# Patient Record
Sex: Female | Born: 1965 | Race: White | Hispanic: No | Marital: Single | Smoking: Never smoker
Health system: Southern US, Community
[De-identification: ages and names within clinical notes are randomized; demographics above are authoritative.]

## PROBLEM LIST (undated history)

## (undated) DIAGNOSIS — D649 Anemia, unspecified: Secondary | ICD-10-CM

## (undated) DIAGNOSIS — C50919 Malignant neoplasm of unspecified site of unspecified female breast: Secondary | ICD-10-CM

## (undated) DIAGNOSIS — G039 Meningitis, unspecified: Secondary | ICD-10-CM

## (undated) DIAGNOSIS — A770 Spotted fever due to Rickettsia rickettsii: Secondary | ICD-10-CM

## (undated) DIAGNOSIS — C539 Malignant neoplasm of cervix uteri, unspecified: Secondary | ICD-10-CM

## (undated) HISTORY — PX: BREAST SURGERY: SHX581

## (undated) HISTORY — PX: SPLENECTOMY: SUR1306

## (undated) HISTORY — PX: OTHER SURGICAL HISTORY: SHX169

---

## 2022-01-28 ENCOUNTER — Encounter (HOSPITAL_COMMUNITY): Payer: Self-pay

## 2022-01-28 ENCOUNTER — Emergency Department (HOSPITAL_COMMUNITY): Payer: Self-pay

## 2022-01-28 ENCOUNTER — Inpatient Hospital Stay (HOSPITAL_COMMUNITY)
Admission: EM | Admit: 2022-01-28 | Discharge: 2022-02-01 | DRG: 812 | Disposition: A | Discharge status: Home / Self Care | Payer: Self-pay | Attending: Internal Medicine | Admitting: Internal Medicine

## 2022-01-28 ENCOUNTER — Other Ambulatory Visit: Payer: Self-pay

## 2022-01-28 DIAGNOSIS — R1310 Dysphagia, unspecified: Secondary | ICD-10-CM

## 2022-01-28 DIAGNOSIS — R52 Pain, unspecified: Secondary | ICD-10-CM | POA: Diagnosis present

## 2022-01-28 DIAGNOSIS — T454X5A Adverse effect of iron and its compounds, initial encounter: Secondary | ICD-10-CM | POA: Diagnosis present

## 2022-01-28 DIAGNOSIS — D72829 Elevated white blood cell count, unspecified: Secondary | ICD-10-CM | POA: Diagnosis present

## 2022-01-28 DIAGNOSIS — Q438 Other specified congenital malformations of intestine: Secondary | ICD-10-CM

## 2022-01-28 DIAGNOSIS — D501 Sideropenic dysphagia: Secondary | ICD-10-CM | POA: Diagnosis present

## 2022-01-28 DIAGNOSIS — Z23 Encounter for immunization: Secondary | ICD-10-CM

## 2022-01-28 DIAGNOSIS — Z885 Allergy status to narcotic agent status: Secondary | ICD-10-CM

## 2022-01-28 DIAGNOSIS — G43909 Migraine, unspecified, not intractable, without status migrainosus: Secondary | ICD-10-CM | POA: Diagnosis present

## 2022-01-28 DIAGNOSIS — Z79899 Other long term (current) drug therapy: Secondary | ICD-10-CM

## 2022-01-28 DIAGNOSIS — K5909 Other constipation: Secondary | ICD-10-CM | POA: Diagnosis present

## 2022-01-28 DIAGNOSIS — Z20822 Contact with and (suspected) exposure to covid-19: Secondary | ICD-10-CM | POA: Diagnosis present

## 2022-01-28 DIAGNOSIS — Z853 Personal history of malignant neoplasm of breast: Secondary | ICD-10-CM

## 2022-01-28 DIAGNOSIS — F32A Depression, unspecified: Secondary | ICD-10-CM

## 2022-01-28 DIAGNOSIS — F4320 Adjustment disorder, unspecified: Secondary | ICD-10-CM | POA: Diagnosis present

## 2022-01-28 DIAGNOSIS — N3000 Acute cystitis without hematuria: Secondary | ICD-10-CM

## 2022-01-28 DIAGNOSIS — Z634 Disappearance and death of family member: Secondary | ICD-10-CM

## 2022-01-28 DIAGNOSIS — Z539 Procedure and treatment not carried out, unspecified reason: Secondary | ICD-10-CM | POA: Diagnosis present

## 2022-01-28 DIAGNOSIS — G8929 Other chronic pain: Secondary | ICD-10-CM | POA: Diagnosis present

## 2022-01-28 DIAGNOSIS — Z888 Allergy status to other drugs, medicaments and biological substances status: Secondary | ICD-10-CM

## 2022-01-28 DIAGNOSIS — Y92239 Unspecified place in hospital as the place of occurrence of the external cause: Secondary | ICD-10-CM | POA: Diagnosis present

## 2022-01-28 DIAGNOSIS — Z800 Family history of malignant neoplasm of digestive organs: Secondary | ICD-10-CM

## 2022-01-28 DIAGNOSIS — Z8541 Personal history of malignant neoplasm of cervix uteri: Secondary | ICD-10-CM

## 2022-01-28 DIAGNOSIS — F410 Panic disorder [episodic paroxysmal anxiety] without agoraphobia: Secondary | ICD-10-CM | POA: Diagnosis present

## 2022-01-28 DIAGNOSIS — T380X5A Adverse effect of glucocorticoids and synthetic analogues, initial encounter: Secondary | ICD-10-CM | POA: Diagnosis present

## 2022-01-28 DIAGNOSIS — R519 Headache, unspecified: Secondary | ICD-10-CM

## 2022-01-28 DIAGNOSIS — G4489 Other headache syndrome: Secondary | ICD-10-CM

## 2022-01-28 DIAGNOSIS — N390 Urinary tract infection, site not specified: Secondary | ICD-10-CM

## 2022-01-28 DIAGNOSIS — K222 Esophageal obstruction: Secondary | ICD-10-CM | POA: Diagnosis present

## 2022-01-28 DIAGNOSIS — K449 Diaphragmatic hernia without obstruction or gangrene: Secondary | ICD-10-CM | POA: Diagnosis present

## 2022-01-28 DIAGNOSIS — D509 Iron deficiency anemia, unspecified: Principal | ICD-10-CM | POA: Diagnosis present

## 2022-01-28 DIAGNOSIS — D649 Anemia, unspecified: Principal | ICD-10-CM

## 2022-01-28 DIAGNOSIS — Z8661 Personal history of infections of the central nervous system: Secondary | ICD-10-CM

## 2022-01-28 DIAGNOSIS — K130 Diseases of lips: Secondary | ICD-10-CM | POA: Diagnosis present

## 2022-01-28 DIAGNOSIS — M436 Torticollis: Secondary | ICD-10-CM

## 2022-01-28 DIAGNOSIS — R011 Cardiac murmur, unspecified: Secondary | ICD-10-CM | POA: Diagnosis present

## 2022-01-28 DIAGNOSIS — Z880 Allergy status to penicillin: Secondary | ICD-10-CM

## 2022-01-28 DIAGNOSIS — D75839 Thrombocytosis, unspecified: Secondary | ICD-10-CM

## 2022-01-28 HISTORY — DX: Spotted fever due to Rickettsia rickettsii: A77.0

## 2022-01-28 HISTORY — DX: Malignant neoplasm of cervix uteri, unspecified: C53.9

## 2022-01-28 HISTORY — DX: Meningitis, unspecified: G03.9

## 2022-01-28 HISTORY — DX: Malignant neoplasm of unspecified site of unspecified female breast: C50.919

## 2022-01-28 HISTORY — DX: Anemia, unspecified: D64.9

## 2022-01-28 LAB — CBC WITH DIFFERENTIAL/PLATELET
Basophils Absolute: 0 10*3/uL (ref 0.0–0.1)
Basophils Relative: 0 %
Eosinophils Absolute: 0.7 10*3/uL — ABNORMAL HIGH (ref 0.0–0.5)
Eosinophils Relative: 6 %
HCT: 27.7 % — ABNORMAL LOW (ref 36.0–46.0)
Hemoglobin: 6.7 g/dL — CL (ref 12.0–15.0)
Lymphocytes Relative: 19 %
Lymphs Abs: 2.1 10*3/uL (ref 0.7–4.0)
MCH: 14.1 pg — ABNORMAL LOW (ref 26.0–34.0)
MCHC: 24.2 g/dL — ABNORMAL LOW (ref 30.0–36.0)
MCV: 58.4 fL — ABNORMAL LOW (ref 80.0–100.0)
Monocytes Absolute: 1.1 10*3/uL — ABNORMAL HIGH (ref 0.1–1.0)
Monocytes Relative: 10 %
Neutro Abs: 7.3 10*3/uL (ref 1.7–7.7)
Neutrophils Relative %: 65 %
Platelets: 1557 10*3/uL (ref 150–400)
RBC: 4.74 MIL/uL (ref 3.87–5.11)
RDW: 25.6 % — ABNORMAL HIGH (ref 11.5–15.5)
Smear Review: INCREASED
WBC: 11.3 10*3/uL — ABNORMAL HIGH (ref 4.0–10.5)
nRBC: 3.1 % — ABNORMAL HIGH (ref 0.0–0.2)
nRBC: 7 /100 WBC — ABNORMAL HIGH

## 2022-01-28 LAB — LACTIC ACID, PLASMA
Lactic Acid, Venous: 1.7 mmol/L (ref 0.5–1.9)
Lactic Acid, Venous: 1.4 mmol/L (ref 0.5–1.9)

## 2022-01-28 LAB — COMPREHENSIVE METABOLIC PANEL
ALT: 14 U/L (ref 0–44)
AST: 24 U/L (ref 15–41)
Albumin: 4.2 g/dL (ref 3.5–5.0)
Alkaline Phosphatase: 55 U/L (ref 38–126)
Anion gap: 7 (ref 5–15)
BUN: 14 mg/dL (ref 6–20)
CO2: 25 mmol/L (ref 22–32)
Calcium: 9.1 mg/dL (ref 8.9–10.3)
Chloride: 107 mmol/L (ref 98–111)
Creatinine, Ser: 0.77 mg/dL (ref 0.44–1.00)
GFR, Estimated: >60 mL/min (ref >60)
Glucose, Bld: 82 mg/dL (ref 70–99)
Potassium: 3.9 mmol/L (ref 3.5–5.1)
Sodium: 139 mmol/L (ref 135–145)
Total Bilirubin: 0.1 mg/dL — ABNORMAL LOW (ref 0.3–1.2)
Total Protein: 8.1 g/dL (ref 6.5–8.1)

## 2022-01-28 LAB — MAGNESIUM: Magnesium: 1.9 mg/dL (ref 1.7–2.4)

## 2022-01-28 LAB — URINALYSIS, ROUTINE W REFLEX MICROSCOPIC
Bilirubin Urine: NEGATIVE
Glucose, UA: NEGATIVE mg/dL
Hgb urine dipstick: NEGATIVE
Ketones, ur: NEGATIVE mg/dL
Nitrite: NEGATIVE
Protein, ur: NEGATIVE mg/dL
Specific Gravity, Urine: 1.012 (ref 1.005–1.030)
pH: 5 (ref 5.0–8.0)

## 2022-01-28 LAB — RETICULOCYTES
Immature Retic Fract: 58.6 % — ABNORMAL HIGH (ref 2.3–15.9)
RBC.: 4.94 MIL/uL (ref 3.87–5.11)
Retic Count, Absolute: 21.2 10*3/uL (ref 19.0–186.0)
Retic Ct Pct: 0.4 % (ref 0.4–3.1)

## 2022-01-28 LAB — ABO/RH: ABO/RH(D): O NEG

## 2022-01-28 LAB — RESP PANEL BY RT-PCR (FLU A&B, COVID) ARPGX2
Influenza A by PCR: NEGATIVE
Influenza B by PCR: NEGATIVE
SARS Coronavirus 2 by RT PCR: NEGATIVE

## 2022-01-28 LAB — FOLATE: Folate: 8.4 ng/mL

## 2022-01-28 LAB — IRON AND TIBC
Iron: 17 ug/dL — ABNORMAL LOW (ref 28–170)
Saturation Ratios: 3 % — ABNORMAL LOW (ref 10.4–31.8)
TIBC: 630 ug/dL — ABNORMAL HIGH (ref 250–450)
UIBC: 613 ug/dL

## 2022-01-28 LAB — PREPARE RBC (CROSSMATCH)

## 2022-01-28 LAB — FERRITIN: Ferritin: 1 ng/mL — ABNORMAL LOW (ref 11–307)

## 2022-01-28 LAB — LIPASE, BLOOD: Lipase: 58 U/L — ABNORMAL HIGH (ref 11–51)

## 2022-01-28 LAB — VITAMIN B12: Vitamin B-12: 359 pg/mL (ref 180–914)

## 2022-01-28 IMAGING — CT CT ABD-PELV W/ CM
2 of 5 series · 16 of 46 positions shown, 18 images · IV contrast (agent unspecified)
Comparison: No comparison studies available.

CLINICAL DATA: Nonlocalized abdominal pain.

EXAM:
CT ABDOMEN AND PELVIS WITH CONTRAST
TECHNIQUE: Multidetector CT imaging of the abdomen and pelvis was performed
using the standard protocol following bolus administration of
intravenous contrast.

[Series 3: axial st · axial · 0.73mm/px · z∈[+655,+1070]mm · 13 of 93 slices shown, 15 images]
[im 5/93  soft-tissue]
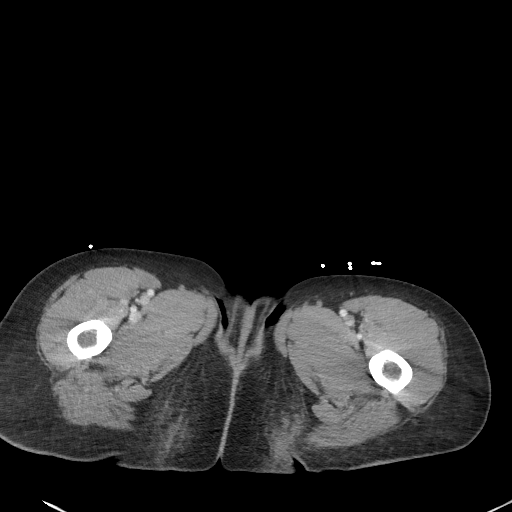
[im 5/93  bone]
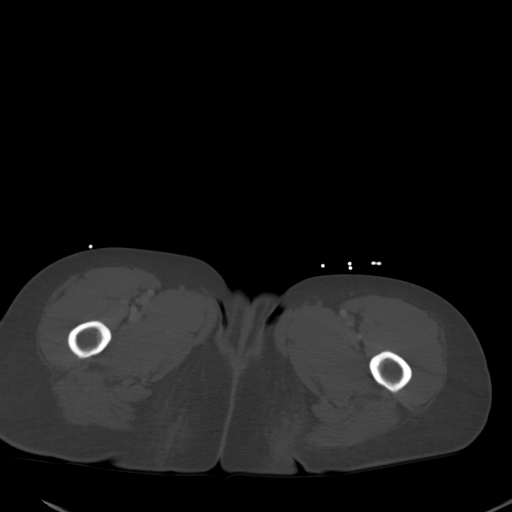
[im 15/93  soft-tissue]
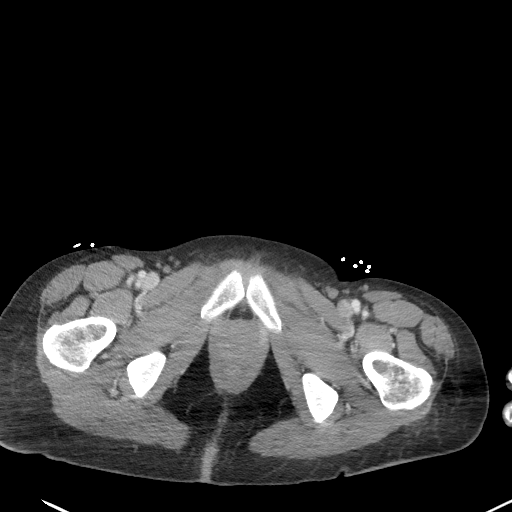
[im 20/93  soft-tissue]
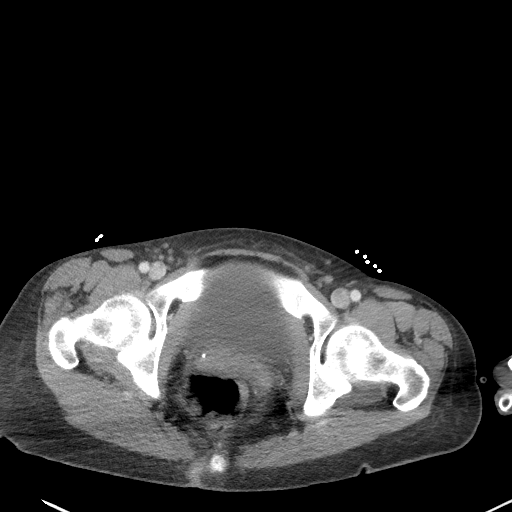
[im 25/93  soft-tissue]
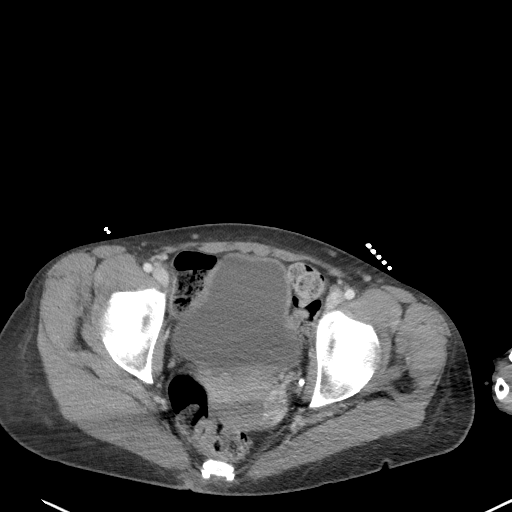
[im 34/93  soft-tissue]
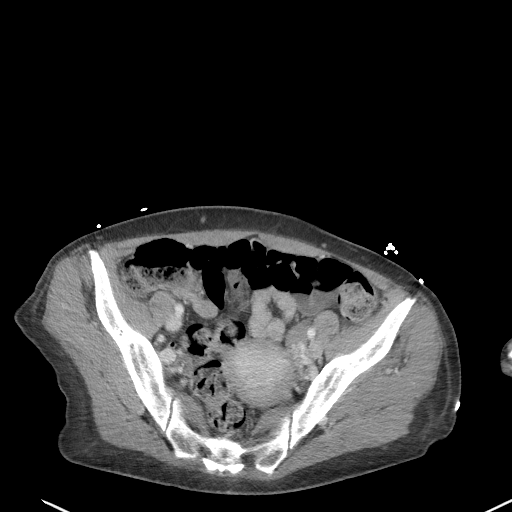
[im 39/93  soft-tissue]
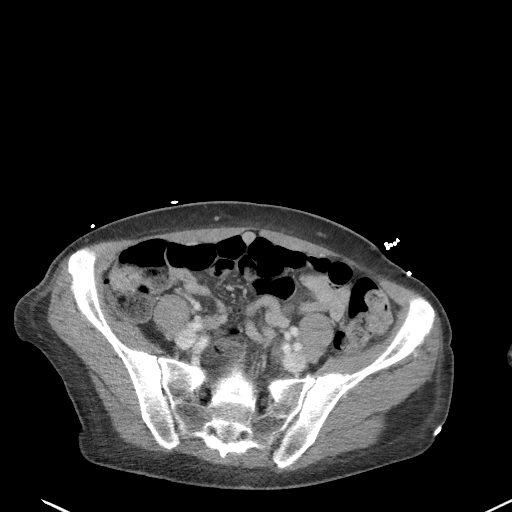
[im 49/93  soft-tissue]
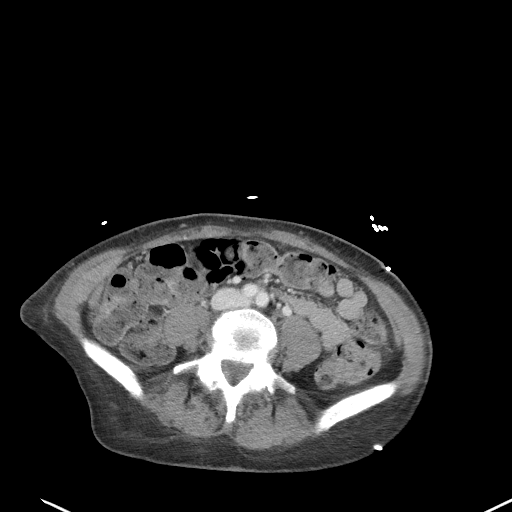
[im 54/93  soft-tissue]
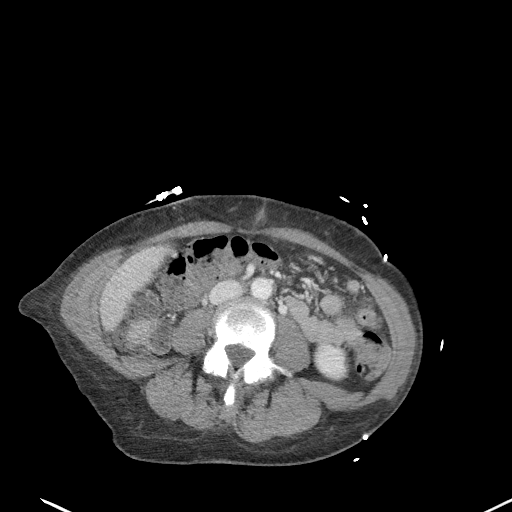
[im 59/93  soft-tissue]
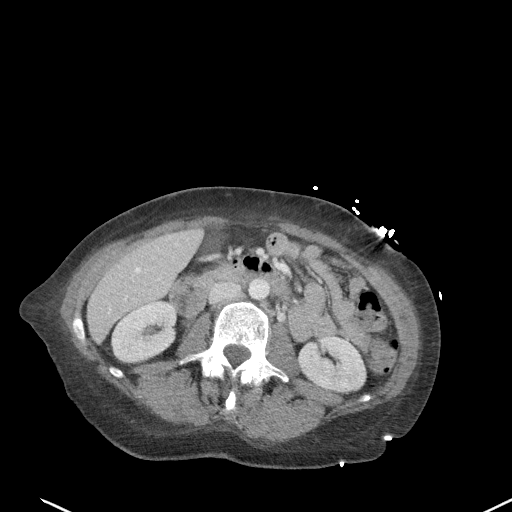
[im 59/93  bone]
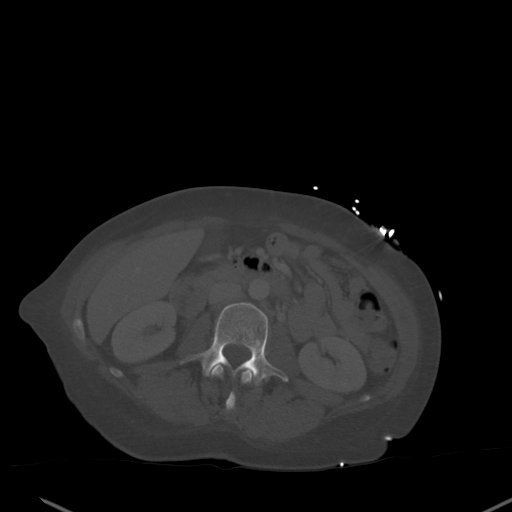
[im 68/93  soft-tissue]
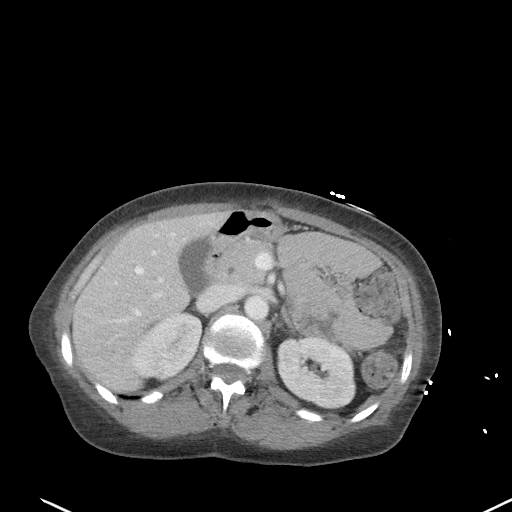
[im 73/93  soft-tissue]
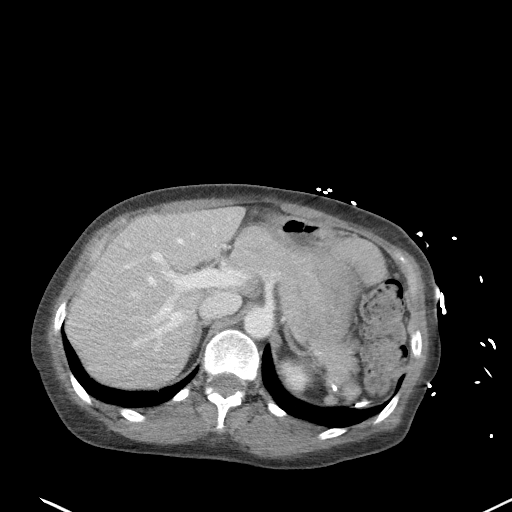
[im 78/93  soft-tissue]
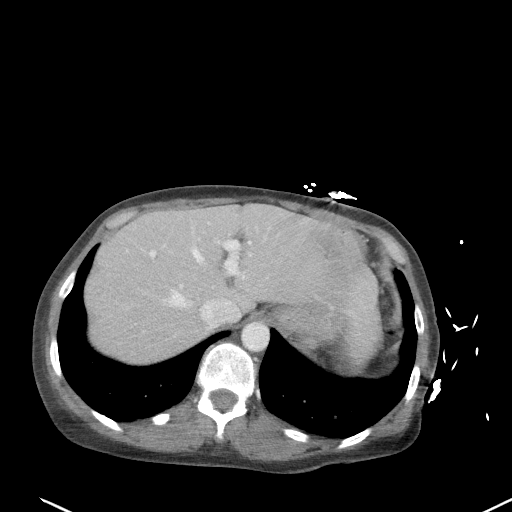
[im 88/93  soft-tissue]
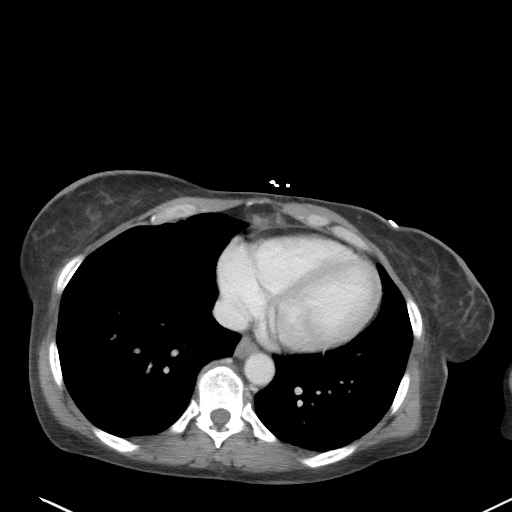

[Series 7: coronal st · coronal · 0.77mm/px · 3 of 92 slices shown]
[im 31/92  soft-tissue]
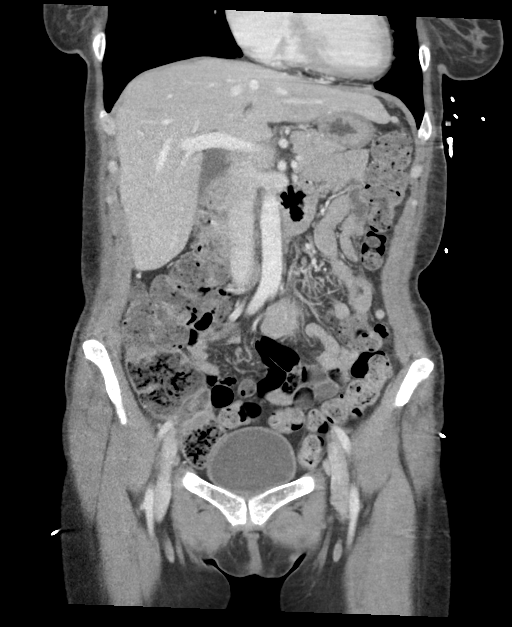
[im 41/92  soft-tissue]
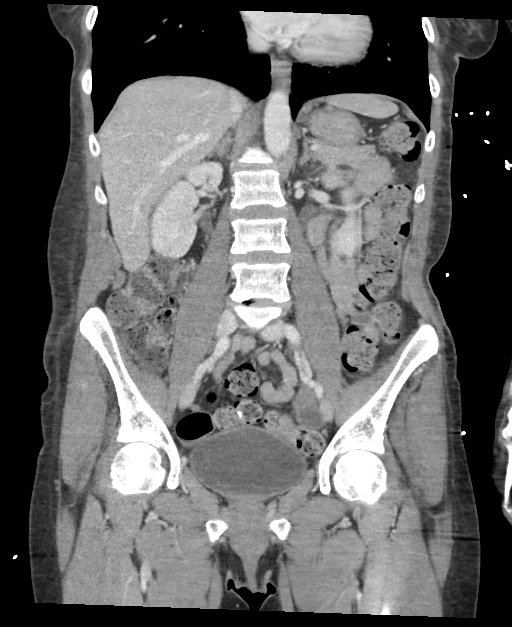
[im 51/92  soft-tissue]
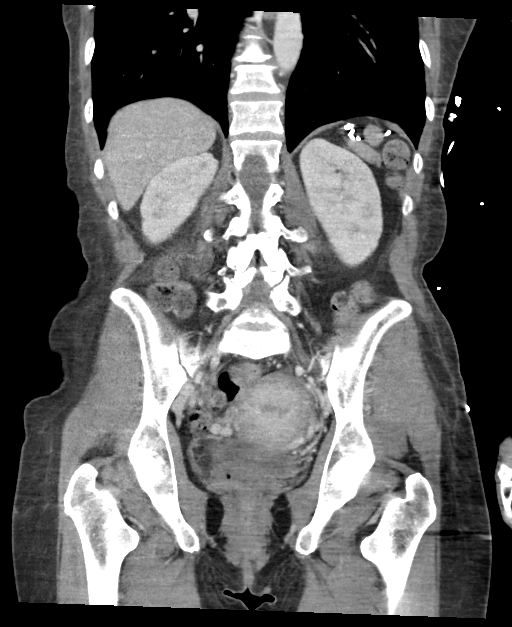

[16 of 46 positions shown; findings below may reference images not displayed]

RADIATION DOSE REDUCTION: This exam was performed according to the
departmental dose-optimization program which includes automated
exposure control, adjustment of the mA and/or kV according to
patient size and/or use of iterative reconstruction technique.

CONTRAST:  75mL OMNIPAQUE IOHEXOL 300 MG/ML  SOLN
FINDINGS: Lower chest: Unremarkable

Hepatobiliary: No suspicious focal abnormality within the liver
parenchyma. Probable adenomyomatosis at the gallbladder fundus. No
intrahepatic or extrahepatic biliary dilation.

Pancreas: No focal mass lesion. No dilatation of the main duct. No
intraparenchymal cyst. No peripancreatic edema.

Spleen: Splenectomy with splenic remnant/splenule visible in the
left upper quadrant.

Adrenals/Urinary Tract: No adrenal nodule or mass. Cortical scarring
noted right kidney. Left kidney unremarkable. No evidence for
hydroureter. The urinary bladder appears normal for the degree of
distention.

Stomach/Bowel: Stomach is unremarkable. No gastric wall thickening.
No evidence of outlet obstruction. Duodenum is normally positioned
as is the ligament of Treitz. No small bowel wall thickening. No
small bowel dilatation. The terminal ileum is normal. The appendix
is normal. No gross colonic mass. No colonic wall thickening.
Moderate stool volume throughout.

Vascular/Lymphatic: No abdominal aortic aneurysm. There is no
gastrohepatic or hepatoduodenal ligament lymphadenopathy. No
retroperitoneal or mesenteric lymphadenopathy. No pelvic sidewall
lymphadenopathy.

Reproductive: The uterus is unremarkable.  There is no adnexal mass.

Other: No intraperitoneal free fluid.

Musculoskeletal: No worrisome lytic or sclerotic osseous
abnormality.
IMPRESSION: 1. No acute findings in the abdomen or pelvis.
2. Moderate stool volume. Imaging features can be seen in the
setting of clinical constipation.

## 2022-01-28 IMAGING — DX DG CHEST 1V PORT
1 series · 1 of 1 positions shown · non-contrast
Comparison: None Available.

CLINICAL DATA: Body aches.  Headache.

EXAM:
PORTABLE CHEST 1 VIEW

[chest ap]
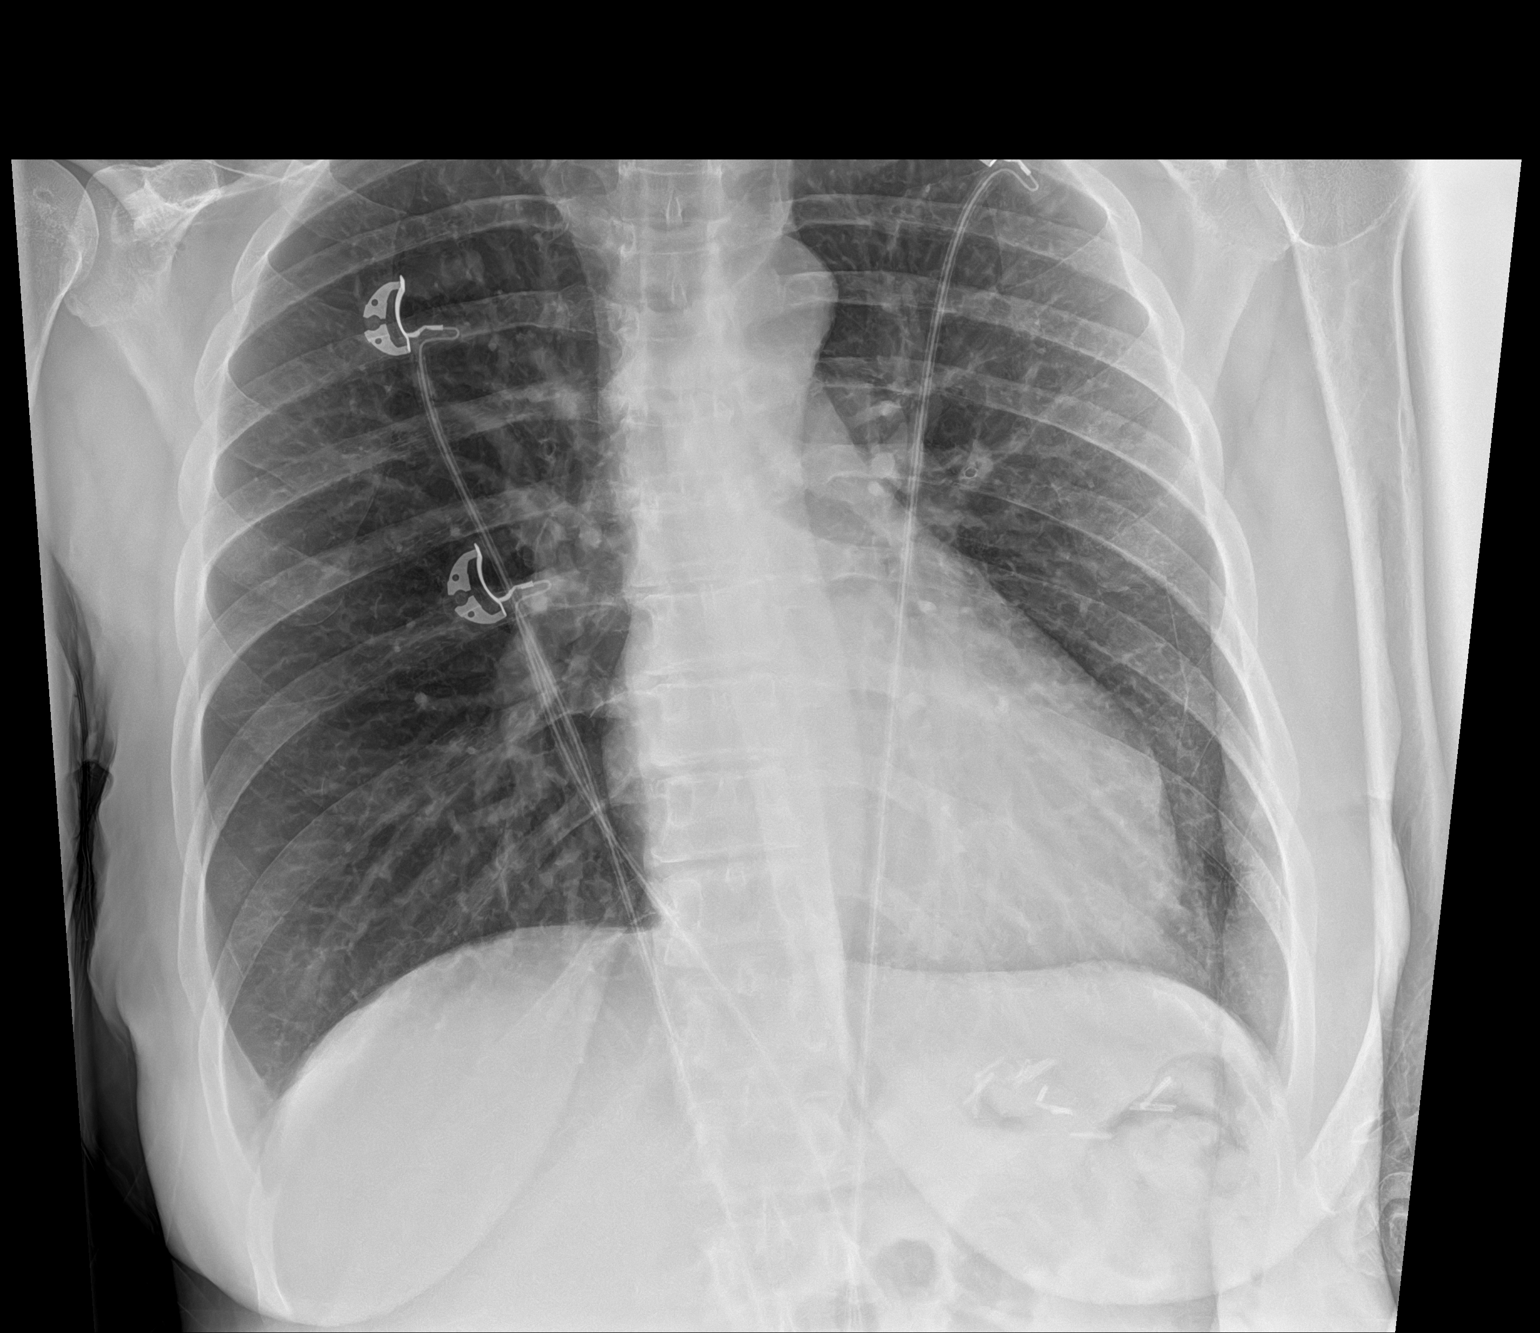

[1 of 1 positions shown; findings below may reference images not displayed]

FINDINGS: Cardiac silhouette is normal in size. Normal mediastinal and hilar
contours.

Clear lungs.  No pleural effusion or pneumothorax.

Skeletal structures are grossly intact. Surgical vascular clips
noted in the left upper quadrant.
IMPRESSION: No active disease.

## 2022-01-28 IMAGING — CT CT HEAD W/O CM
3 of 4 series · 16 of 47 positions shown, 19 images · non-contrast
Comparison: None Available.

CLINICAL DATA: Sudden onset severe headache.



[Series 3: head w o · axial · 0.46mm/px · z∈[+1382,+1527]mm · 10 of 35 slices shown, 13 images]
[im 3/35  brain]
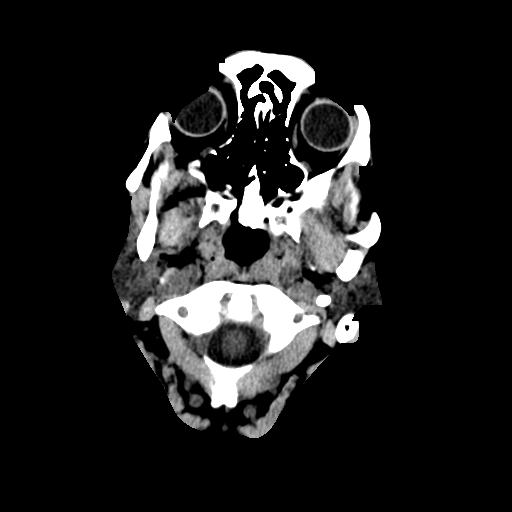
[im 3/35  bone]
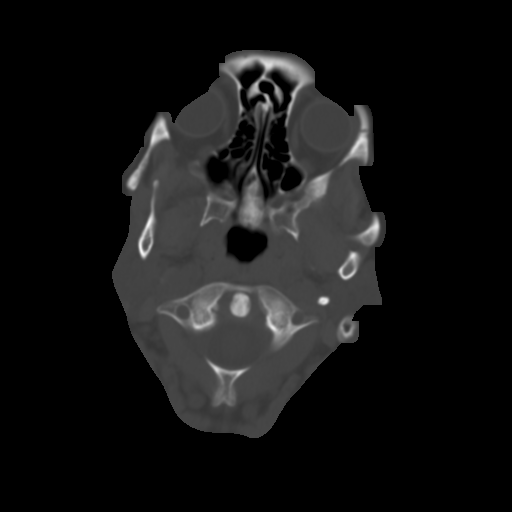
[im 5/35  brain]
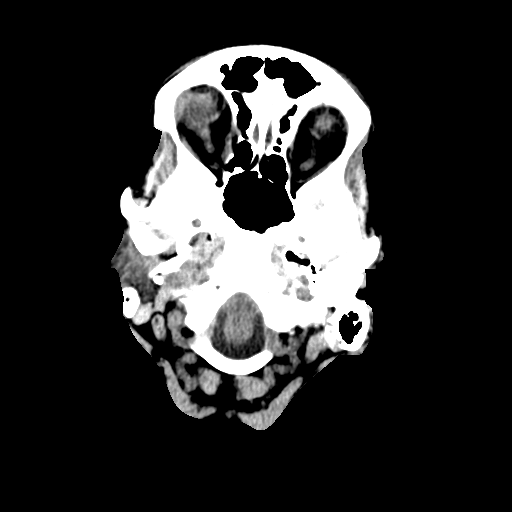
[im 10/35  brain]
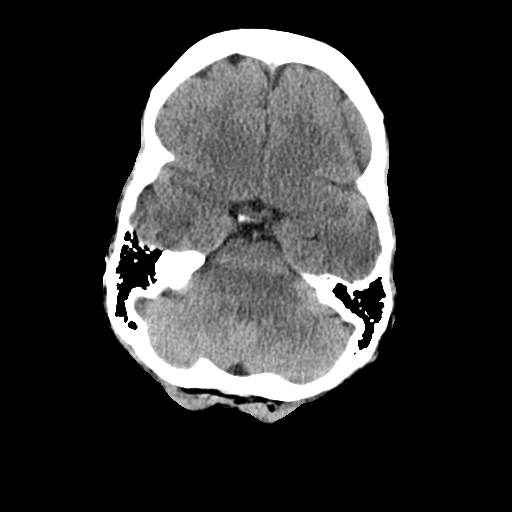
[im 13/35  brain]
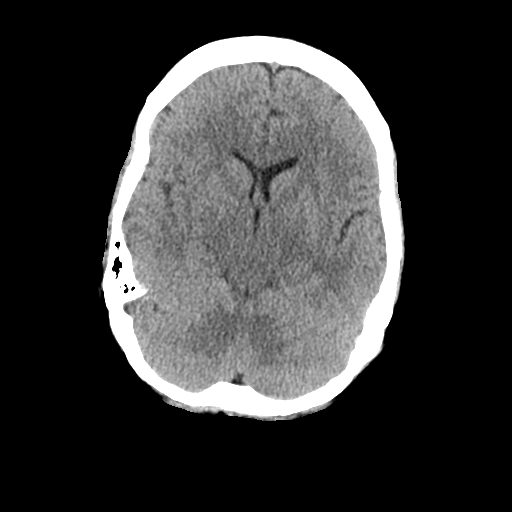
[im 15/35  brain]
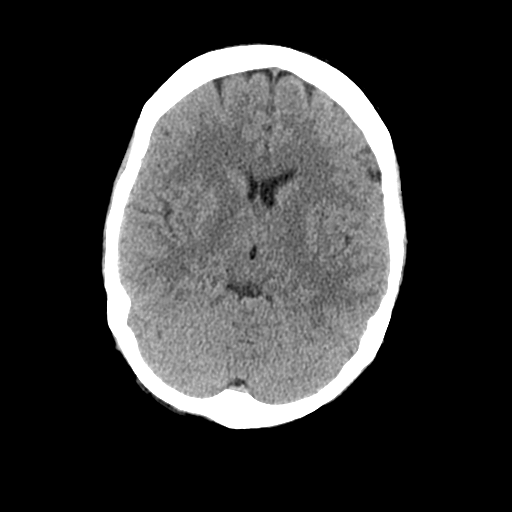
[im 15/35  bone]
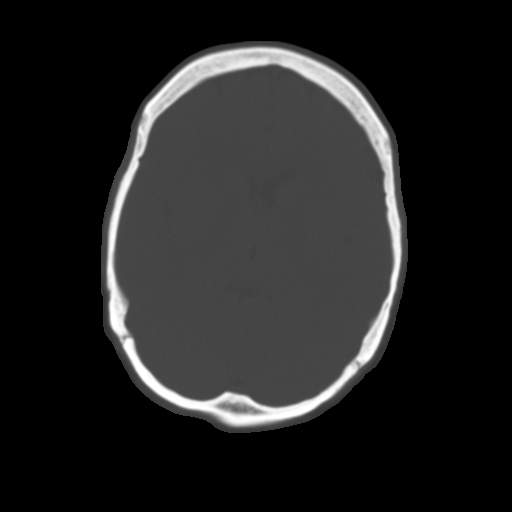
[im 20/35  brain]
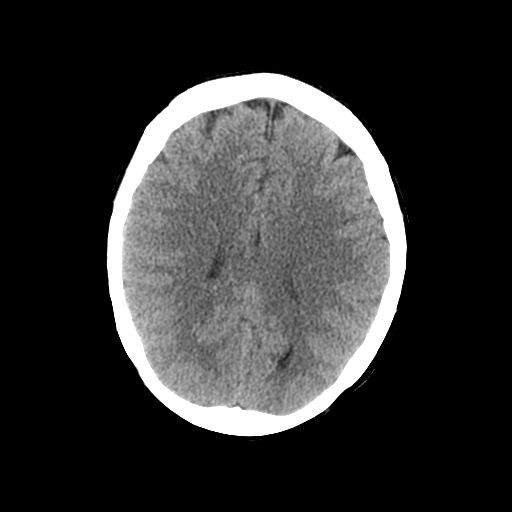
[im 22/35  brain]
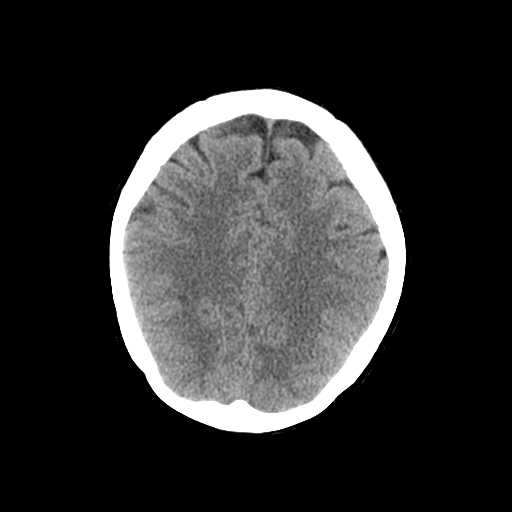
[im 25/35  brain]
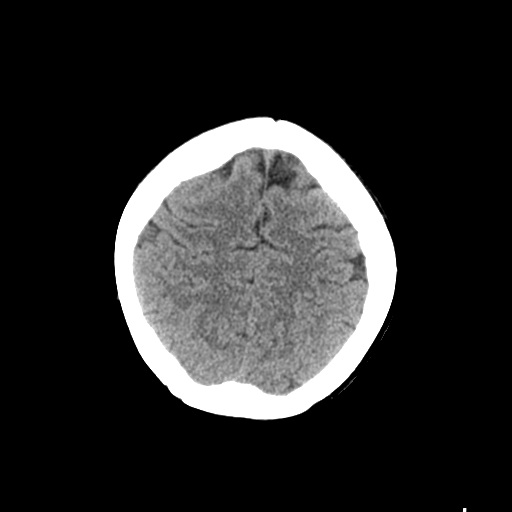
[im 30/35  brain]
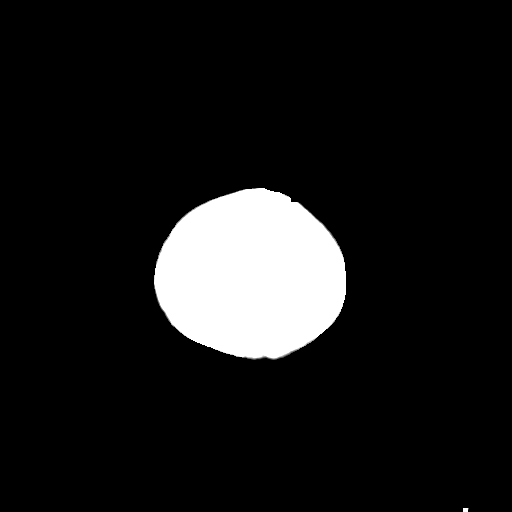
[im 30/35  bone]
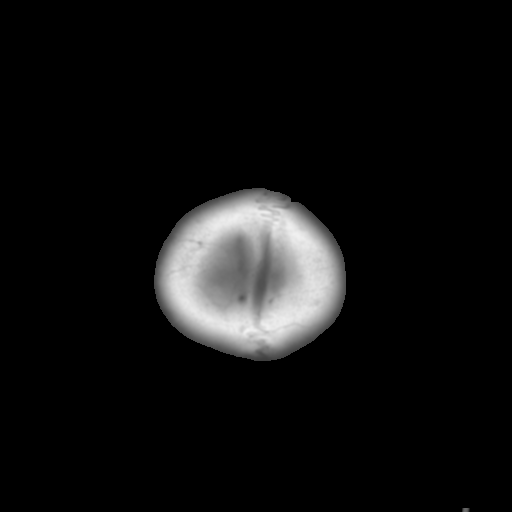
[im 32/35  brain]
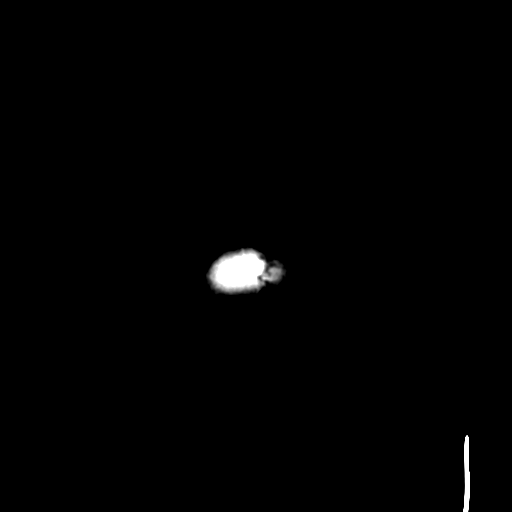

[Series 5: coronal soft · coronal · 0.31mm/px · 3 of 71 slices shown]
[im 24/71  brain]
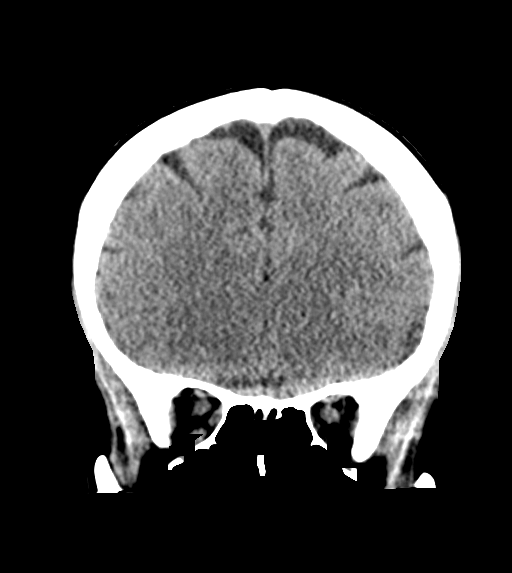
[im 32/71  brain]
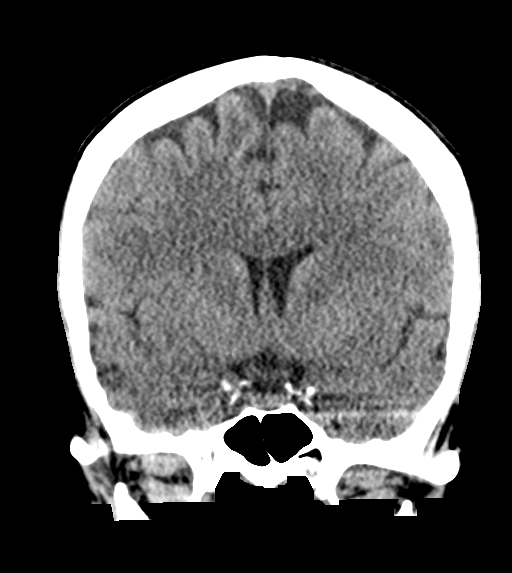
[im 39/71  brain]
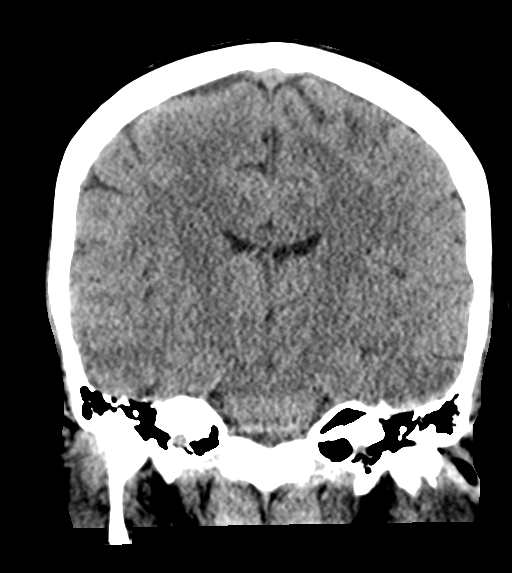

[Series 6: sagittal soft · sagittal · 0.35mm/px · 3 of 57 slices shown]
[im 19/57  brain]
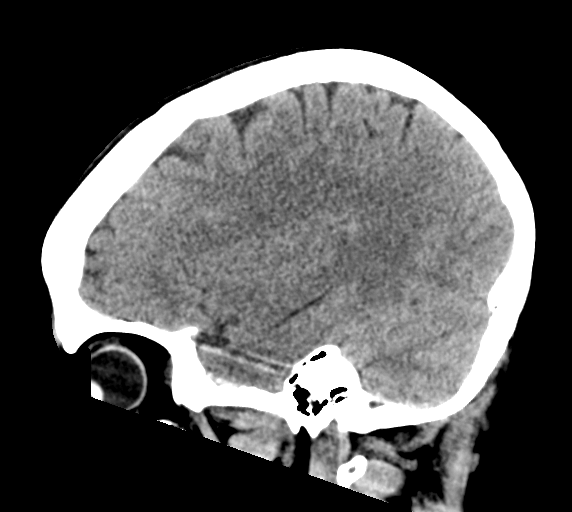
[im 29/57  brain]
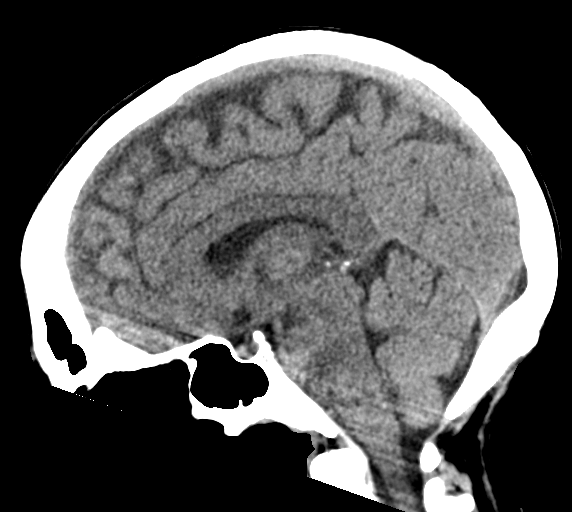
[im 38/57  brain]
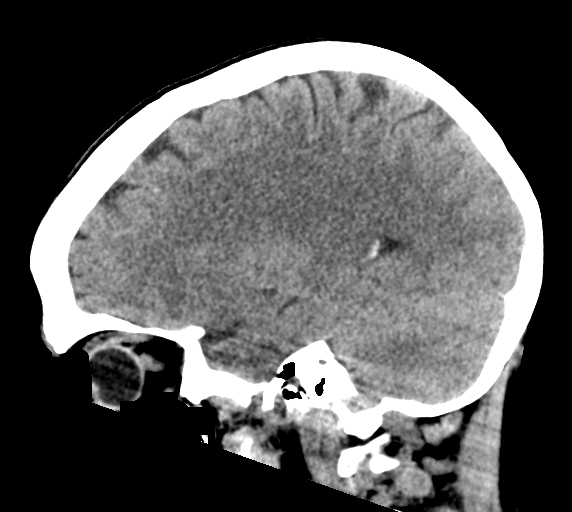

[16 of 47 positions shown; findings below may reference images not displayed]

FINDINGS: Brain: There is no evidence for acute hemorrhage, hydrocephalus,
mass lesion, or abnormal extra-axial fluid collection. No definite
CT evidence for acute infarction.

Vascular: No hyperdense vessel or unexpected calcification.

Skull: No evidence for fracture. No worrisome lytic or sclerotic
lesion.

Sinuses/Orbits: The visualized paranasal sinuses and mastoid air
cells are clear. Visualized portions of the globes and intraorbital
fat are unremarkable.

Other: None.
IMPRESSION: No acute intracranial abnormality.

## 2022-01-28 IMAGING — CT CT NECK W/ CM
4 of 5 series · 14 of 33 positions shown, 16 images · IV contrast (Omnipaque or Isovue)
Comparison: None Available.

CLINICAL DATA: Palate weakness with difficulty swallowing. Stiff
neck for a few days

EXAM:
CT NECK WITH CONTRAST
TECHNIQUE: Multidetector CT imaging of the neck was performed using the
standard protocol following the bolus administration of intravenous
contrast.

[Series 3: axial neck · axial · 0.58mm/px · z∈[+1250,+1312]mm · 2 of 94 slices shown]
[im 32/94  bone]
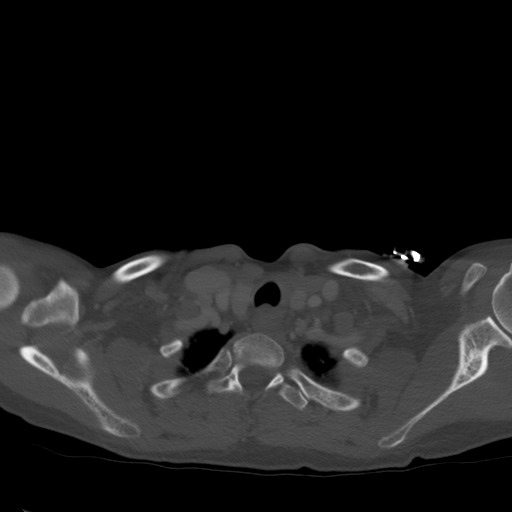
[im 63/94  bone]
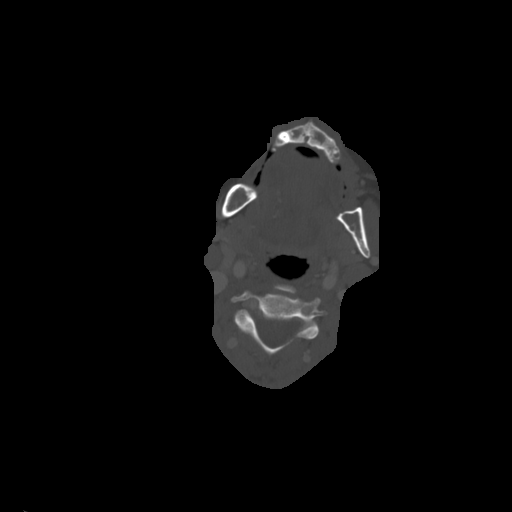

[Series 5: cor neck · coronal · 0.44mm/px · 3 of 146 slices shown]
[im 30/146  bone]
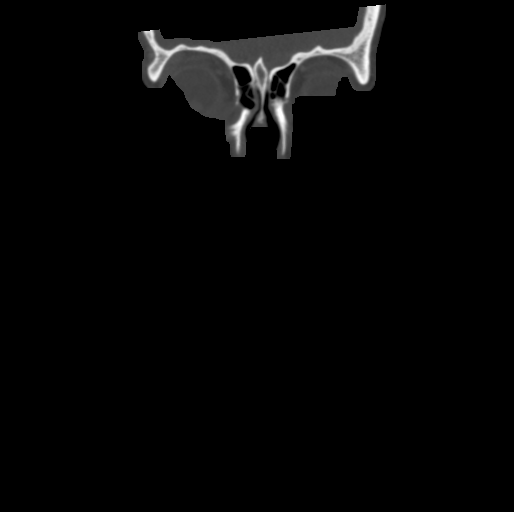
[im 59/146  bone]
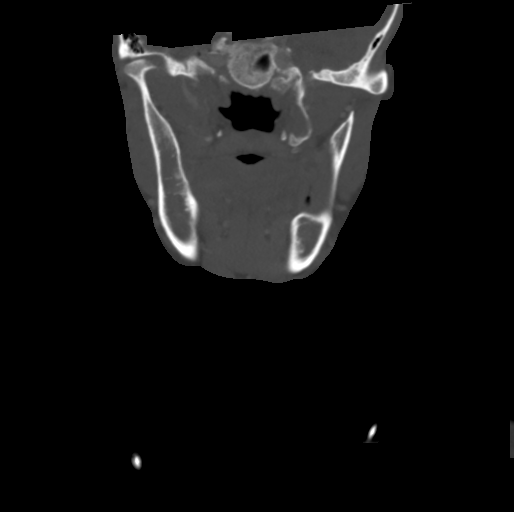
[im 88/146  bone]
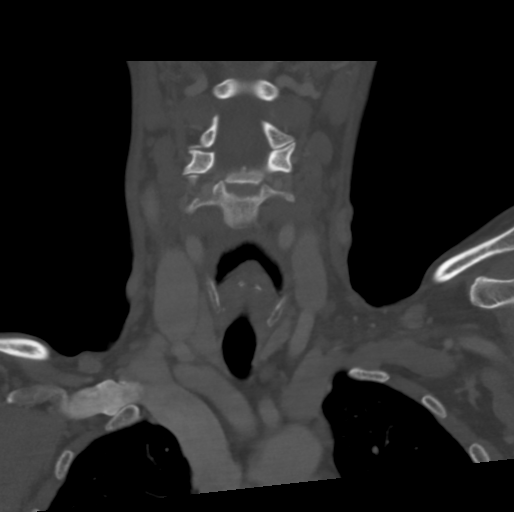

[Series 6: sag neck · sagittal · 0.46mm/px · 5 of 101 slices shown, 6 images]
[im 34/101  bone]
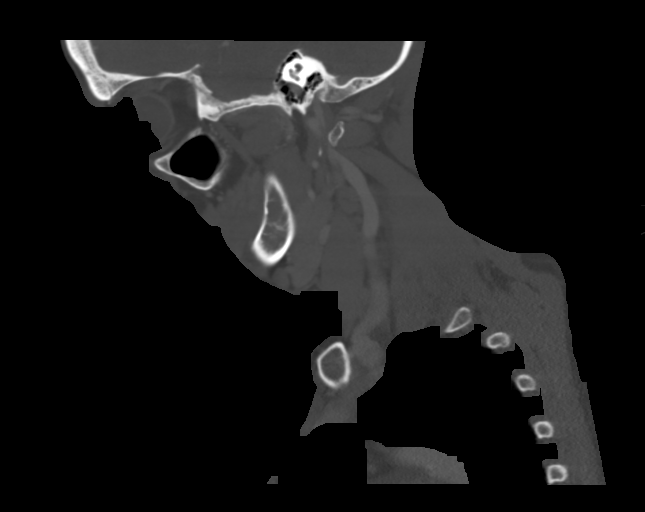
[im 42/101  bone]
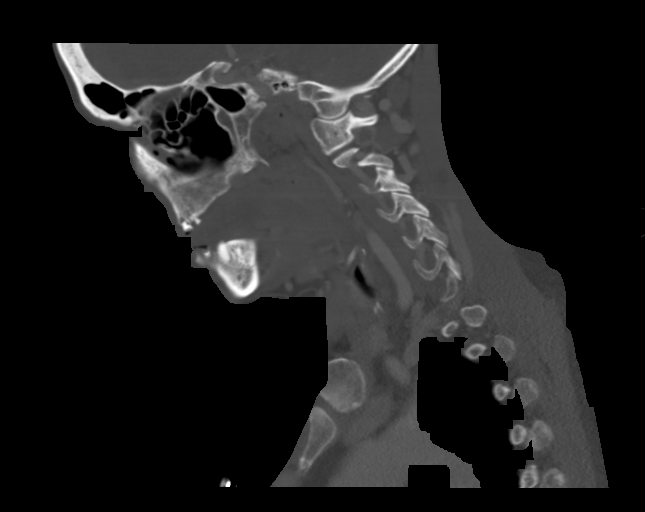
[im 51/101  soft-tissue]
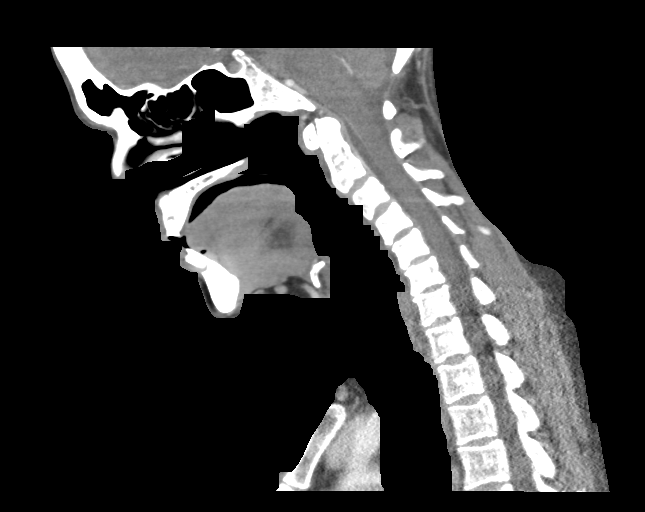
[im 51/101  bone]
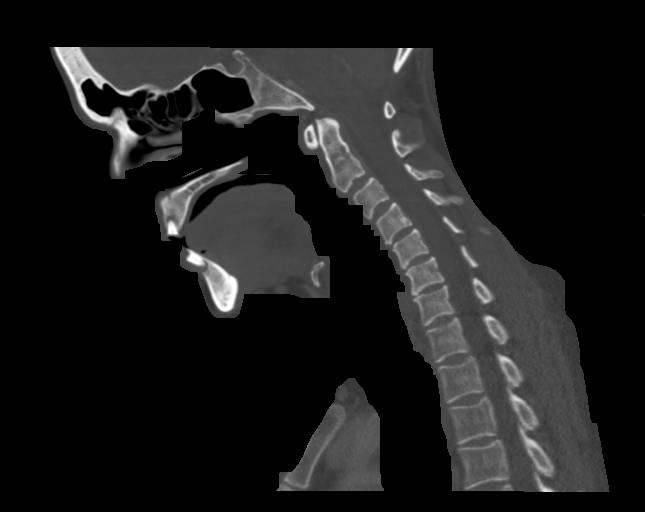
[im 59/101  bone]
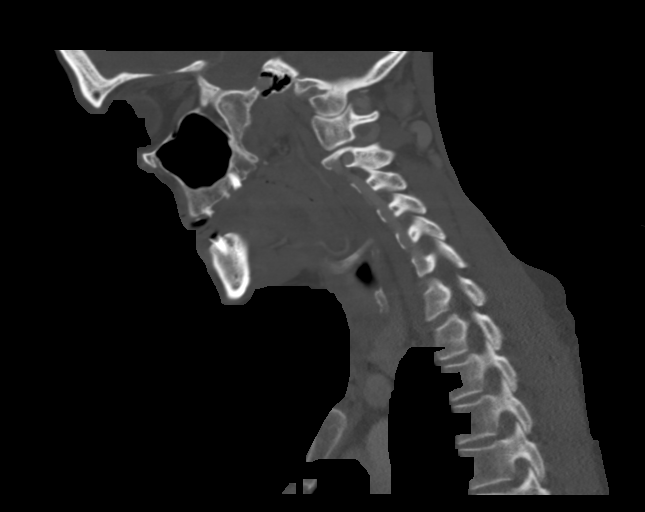
[im 67/101  bone]
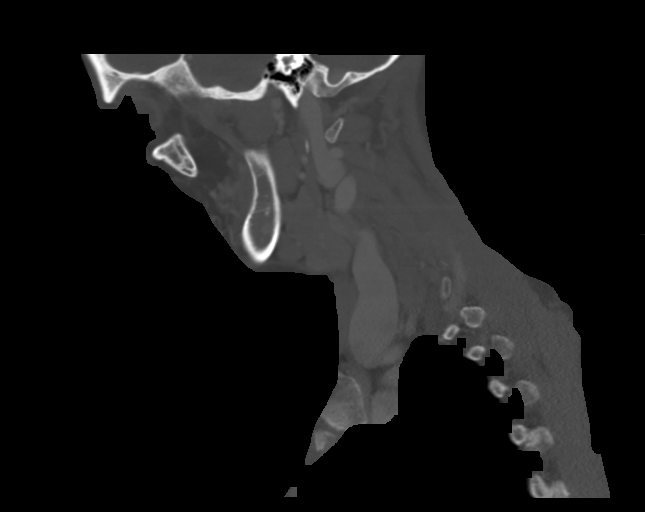

[Series 7: ax oropharynx (person_name) · axial · 0.39mm/px · z∈[+1174,+1347]mm · 4 of 161 slices shown, 5 images]
[im 33/161  soft-tissue]
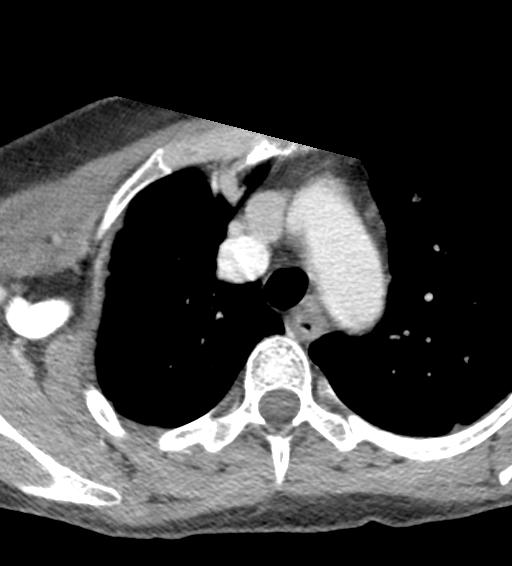
[im 33/161  bone]
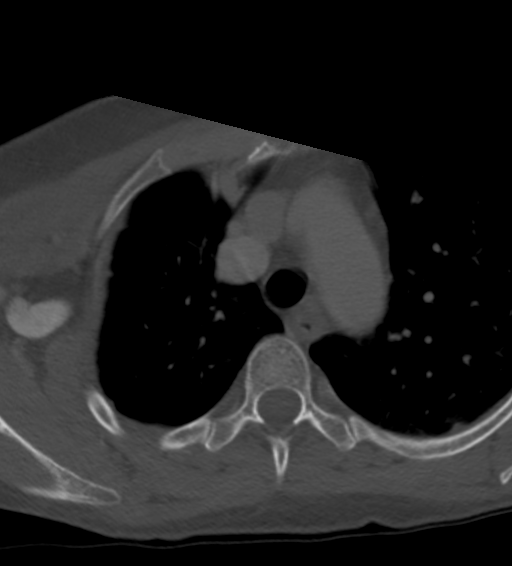
[im 65/161  bone]
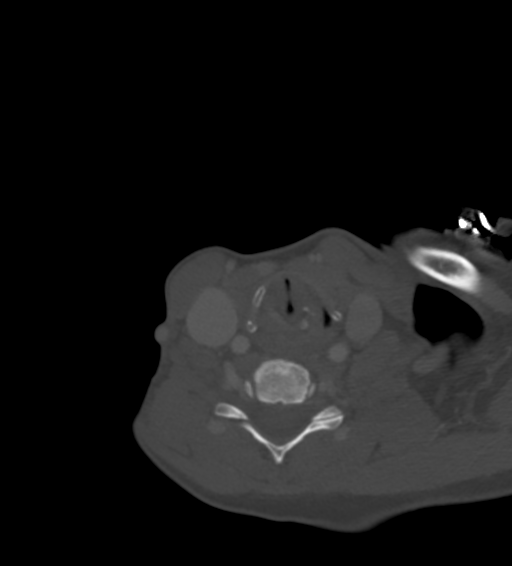
[im 97/161  bone]
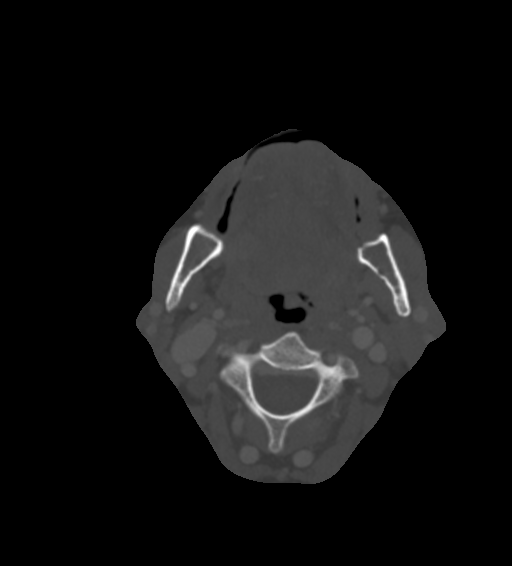
[im 129/161  bone]
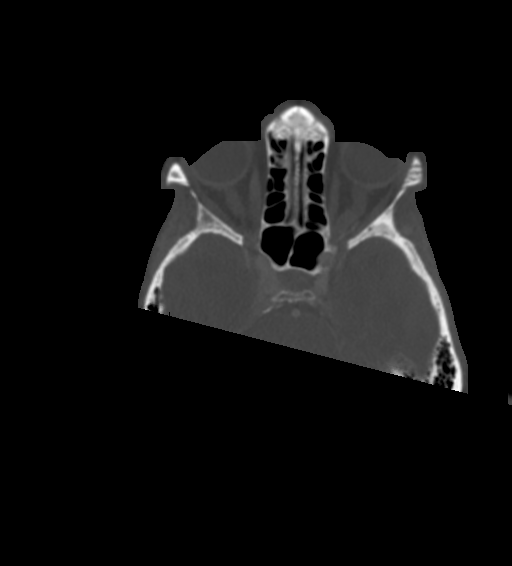

[14 of 33 positions shown; findings below may reference images not displayed]

RADIATION DOSE REDUCTION: This exam was performed according to the
departmental dose-optimization program which includes automated
exposure control, adjustment of the mA and/or kV according to
patient size and/or use of iterative reconstruction technique.

CONTRAST:  75mL OMNIPAQUE IOHEXOL 300 MG/ML  SOLN
FINDINGS: Pharynx and larynx: Normal. No mass or swelling.

Salivary glands: No inflammation, mass, or stone.

Thyroid: Normal.

Lymph nodes: None enlarged or abnormal density.

Vascular: Negative.

Limited intracranial: Negative.

Visualized orbits: Negative

Mastoids and visualized paranasal sinuses: Clear

Skeleton: Advanced dental caries with diffuse deep erosions and
periapical erosion. No acute finding

Upper chest: Negative.
IMPRESSION: No acute finding or explanation for symptoms.

## 2022-01-28 MED ORDER — OXYCODONE-ACETAMINOPHEN 5-325 MG PO TABS
1.0000 | ORAL_TABLET | Freq: Once | ORAL | Status: AC
  Administered 2022-01-28: 1 via ORAL
  Filled 2022-01-28: qty 1

## 2022-01-28 MED ORDER — VANCOMYCIN HCL IN DEXTROSE 1-5 GM/200ML-% IV SOLN
1000.0000 mg | Freq: Once | INTRAVENOUS | Status: AC
  Administered 2022-01-28: 1000 mg via INTRAVENOUS
  Filled 2022-01-28: qty 200

## 2022-01-28 MED ORDER — SODIUM CHLORIDE 0.9 % IV SOLN: 10.0000 mL/h | Freq: Once | INTRAVENOUS | Status: DC

## 2022-01-28 MED ORDER — SODIUM CHLORIDE 0.9 % IV SOLN: INTRAVENOUS | Status: DC

## 2022-01-28 MED ORDER — SODIUM CHLORIDE 0.9 % IV BOLUS
1000.0000 mL | Freq: Once | INTRAVENOUS | Status: AC
  Administered 2022-01-28: 1000 mL via INTRAVENOUS

## 2022-01-28 MED ORDER — FERROUS SULFATE 325 (65 FE) MG PO TABS
325.0000 mg | ORAL_TABLET | Freq: Every day | ORAL | Status: DC
Start: 2022-01-28 — End: 2022-01-28
  Filled 2022-01-28: qty 1

## 2022-01-28 MED ORDER — IOHEXOL 300 MG/ML  SOLN
75.0000 mL | Freq: Once | INTRAMUSCULAR | Status: AC | PRN
  Administered 2022-01-28: 75 mL via INTRAVENOUS

## 2022-01-28 MED ORDER — LORAZEPAM 0.5 MG PO TABS
0.5000 mg | ORAL_TABLET | Freq: Once | ORAL | Status: AC
  Administered 2022-01-28: 0.5 mg via ORAL
  Filled 2022-01-28: qty 1

## 2022-01-28 MED ORDER — SODIUM CHLORIDE 0.9 % IV SOLN
2.0000 g | Freq: Once | INTRAVENOUS | Status: AC
  Administered 2022-01-28: 2 g via INTRAVENOUS
  Filled 2022-01-28: qty 20

## 2022-01-28 MED ORDER — PNEUMOCOCCAL 20-VAL CONJ VACC 0.5 ML IM SUSY
0.5000 mL | PREFILLED_SYRINGE | INTRAMUSCULAR | Status: AC
  Administered 2022-02-01: 0.5 mL via INTRAMUSCULAR
  Filled 2022-01-28: qty 0.5

## 2022-01-28 MED ORDER — LORAZEPAM 0.5 MG PO TABS
0.5000 mg | ORAL_TABLET | Freq: Four times a day (QID) | ORAL | Status: DC | PRN
  Administered 2022-01-29 – 2022-02-01 (×4): 0.5 mg via ORAL
  Filled 2022-01-28 (×5): qty 1

## 2022-01-28 MED ORDER — DIPHENHYDRAMINE HCL 50 MG/ML IJ SOLN
25.0000 mg | Freq: Once | INTRAMUSCULAR | Status: AC
Start: 2022-01-28 — End: 2022-01-28
  Administered 2022-01-28: 25 mg via INTRAVENOUS
  Filled 2022-01-28: qty 1

## 2022-01-28 MED ORDER — SODIUM CHLORIDE 0.9 % IV SOLN
1.0000 g | Freq: Once | INTRAVENOUS | Status: AC
  Administered 2022-01-29: 1 g via INTRAVENOUS
  Filled 2022-01-28: qty 10

## 2022-01-28 MED ORDER — DEXAMETHASONE SODIUM PHOSPHATE 10 MG/ML IJ SOLN
10.0000 mg | Freq: Once | INTRAMUSCULAR | Status: AC
  Administered 2022-01-28: 10 mg via INTRAVENOUS
  Filled 2022-01-28: qty 1

## 2022-01-28 MED ORDER — METOCLOPRAMIDE HCL 5 MG/ML IJ SOLN
10.0000 mg | Freq: Once | INTRAMUSCULAR | Status: AC
  Administered 2022-01-28: 10 mg via INTRAVENOUS
  Filled 2022-01-28: qty 2

## 2022-01-28 MED ORDER — HYDROMORPHONE HCL 1 MG/ML IJ SOLN
1.0000 mg | Freq: Once | INTRAMUSCULAR | Status: AC
  Administered 2022-01-28: 1 mg via INTRAVENOUS
  Filled 2022-01-28: qty 1

## 2022-01-28 MED ORDER — FUROSEMIDE 10 MG/ML IJ SOLN
20.0000 mg | Freq: Once | INTRAMUSCULAR | Status: AC
  Administered 2022-01-28: 20 mg via INTRAVENOUS
  Filled 2022-01-28: qty 2

## 2022-01-28 MED ORDER — POLYETHYLENE GLYCOL 3350 17 G PO PACK
17.0000 g | PACK | Freq: Two times a day (BID) | ORAL | Status: DC
  Administered 2022-01-28 – 2022-01-29 (×2): 17 g via ORAL
  Filled 2022-01-28 (×2): qty 1

## 2022-01-28 MED ORDER — PANTOPRAZOLE SODIUM 40 MG IV SOLR
40.0000 mg | INTRAVENOUS | Status: DC
  Administered 2022-01-28 – 2022-01-31 (×4): 40 mg via INTRAVENOUS
  Filled 2022-01-28 (×4): qty 10

## 2022-01-28 MED ORDER — ONDANSETRON HCL 4 MG/2ML IJ SOLN
4.0000 mg | Freq: Once | INTRAMUSCULAR | Status: AC
  Administered 2022-01-28: 4 mg via INTRAVENOUS
  Filled 2022-01-28: qty 2

## 2022-01-28 MED ORDER — ACETAMINOPHEN 325 MG PO TABS
650.0000 mg | ORAL_TABLET | Freq: Four times a day (QID) | ORAL | Status: DC | PRN
  Administered 2022-01-28: 650 mg via ORAL
  Filled 2022-01-28 (×2): qty 2

## 2022-01-28 MED ORDER — ACETAMINOPHEN 650 MG RE SUPP: 650.0000 mg | Freq: Four times a day (QID) | RECTAL | Status: DC | PRN

## 2022-01-28 MED ORDER — SODIUM CHLORIDE 0.9 % IV SOLN
INTRAVENOUS | Status: DC
Start: 2022-01-29 — End: 2022-01-28

## 2022-01-28 NOTE — Assessment & Plan Note (Signed)
Echo

## 2022-01-28 NOTE — ED Provider Notes (Signed)
?[facility] ?Provider Note ? ? ?CSN: [id number] ?Arrival date & time: 01/28/22  0800 ? ?  ? ?History ? ?Chief Complaint  ?Patient presents with  ? Generalized Body Aches  ? Torticollis  ? ? ?Tanya Stout is a 56 y.o. female. ? ?Patient presenting with a complaint of the onset of body aches 3 days ago.  Later in that day of the first day he developed neck stiffness and headache.  Patient states she has had viral meningitis in the past and this reminded of her.  Patient was also not seen a doctor for over 20 years.  States she is got a history of anemia in the past she had a history of cervical cancer.  Associated with some nausea.  No photophobia.  Denies any upper extremity lower extremity weakness denies any back pain. ? ? ?  ? ?Home Medications ?Prior to Admission medications   ?Not on File  ?   ? ?Allergies    ?Morphine and Penicillins   ? ?Review of Systems   ?Review of Systems  ?Constitutional:  Negative for chills and fever.  ?HENT:  Negative for ear pain and sore throat.   ?Eyes:  Negative for pain and visual disturbance.  ?Respiratory:  Negative for cough and shortness of breath.   ?Cardiovascular:  Negative for chest pain and palpitations.  ?Gastrointestinal:  Positive for nausea. Negative for abdominal pain and vomiting.  ?Genitourinary:  Negative for dysuria and hematuria.  ?Musculoskeletal:  Positive for myalgias, neck pain and neck stiffness. Negative for arthralgias and back pain.  ?Skin:  Negative for color change and rash.  ?Neurological:  Positive for headaches. Negative for seizures and syncope.  ?All other systems reviewed and are negative. ? ?Physical Exam ?Updated Vital Signs ?BP 128/79   Pulse 68   Temp 98.1 ?F (36.7 ?C) (Oral)   Resp 20   Ht 1.626 m ('5\' 4"'$ )   Wt 54.4 kg   SpO2 100%   BMI 20.60 kg/m?  ?Physical Exam ?Vitals and nursing note reviewed.  ?Constitutional:   ?   General: She is in acute distress.  ?   Appearance: She is well-developed.  ?HENT:  ?   Head:  Normocephalic and atraumatic.  ?Eyes:  ?   Extraocular Movements: Extraocular movements intact.  ?   Conjunctiva/sclera: Conjunctivae normal.  ?   Pupils: Pupils are equal, round, and reactive to light.  ?Neck:  ?   Comments: Some pain with movement to the of the neck to the right side.  Good movements of the left side.  Did have pain with flexion. ?Cardiovascular:  ?   Rate and Rhythm: Normal rate and regular rhythm.  ?   Heart sounds: No murmur heard. ?Pulmonary:  ?   Effort: Pulmonary effort is normal. No respiratory distress.  ?   Breath sounds: Normal breath sounds. No wheezing.  ?Abdominal:  ?   Palpations: Abdomen is soft.  ?   Tenderness: There is no abdominal tenderness.  ?Musculoskeletal:     ?   General: No swelling.  ?   Cervical back: Normal range of motion.  ?Lymphadenopathy:  ?   Cervical: No cervical adenopathy.  ?Skin: ?   General: Skin is warm and dry.  ?   Capillary Refill: Capillary refill takes less than 2 seconds.  ?Neurological:  ?   General: No focal deficit present.  ?   Mental Status: She is alert and oriented to person, place, and time.  ?   Cranial Nerves:  No cranial nerve deficit.  ?   Sensory: No sensory deficit.  ?   Motor: No weakness.  ?Psychiatric:     ?   Mood and Affect: Mood normal.  ? ? ?ED Results / Procedures / Treatments   ?Labs ?(all labs ordered are listed, but only abnormal results are displayed) ?Labs Reviewed  ?COMPREHENSIVE METABOLIC PANEL - Abnormal; Notable for the following components:  ?    Result Value  ? Total Bilirubin 0.1 (*)   ? All other components within normal limits  ?CBC WITH DIFFERENTIAL/PLATELET - Abnormal; Notable for the following components:  ? WBC 11.3 (*)   ? Hemoglobin 6.7 (*)   ? HCT 27.7 (*)   ? MCV 58.4 (*)   ? MCH 14.1 (*)   ? MCHC 24.2 (*)   ? RDW 25.6 (*)   ? Platelets 1,557 (*)   ? nRBC 3.1 (*)   ? Monocytes Absolute 1.1 (*)   ? Eosinophils Absolute 0.7 (*)   ? nRBC 7 (*)   ? All other components within normal limits  ?LIPASE, BLOOD -  Abnormal; Notable for the following components:  ? Lipase 58 (*)   ? All other components within normal limits  ?URINALYSIS, ROUTINE W REFLEX MICROSCOPIC - Abnormal; Notable for the following components:  ? APPearance HAZY (*)   ? Leukocytes,Ua MODERATE (*)   ? All other components within normal limits  ?RESP PANEL BY RT-PCR (FLU A&B, COVID) [id number]  ?CULTURE, BLOOD (ROUTINE X 2)  ?CULTURE, BLOOD (ROUTINE X 2)  ?CSF CULTURE W GRAM STAIN  ?LACTIC ACID, PLASMA  ?LACTIC ACID, PLASMA  ?CSF CELL COUNT WITH DIFFERENTIAL  ?PROTEIN AND GLUCOSE, CSF  ?TYPE AND SCREEN  ?PREPARE RBC (CROSSMATCH)  ?ABO/RH  ? ? ?EKG ?EKG Interpretation ? ?Date/Time:  Sunday Jan 28 2022 10:50:37 EDT ?Ventricular Rate:  71 ?PR Interval:  143 ?QRS Duration: 86 ?QT Interval:  452 ?QTC Calculation: 492 ?R Axis:   84 ?Text Interpretation: Sinus rhythm Borderline prolonged QT interval No previous ECGs available Confirmed by Fredia Sorrow [phone]) on 01/28/2022 10:55:58 AM ? ?Radiology ?CT Head Wo Contrast ? ?Result Date: 01/28/2022 ?CLINICAL DATA:  Sudden onset severe headache. EXAM: CT HEAD WITHOUT CONTRAST TECHNIQUE: Contiguous axial images were obtained from the base of the skull through the vertex without intravenous contrast. RADIATION DOSE REDUCTION: This exam was performed according to the departmental dose-optimization program which includes automated exposure control, adjustment of the mA and/or kV according to patient size and/or use of iterative reconstruction technique. COMPARISON:  None Available. FINDINGS: Brain: There is no evidence for acute hemorrhage, hydrocephalus, mass lesion, or abnormal extra-axial fluid collection. No definite CT evidence for acute infarction. Vascular: No hyperdense vessel or unexpected calcification. Skull: No evidence for fracture. No worrisome lytic or sclerotic lesion. Sinuses/Orbits: The visualized paranasal sinuses and mastoid air cells are clear. Visualized portions of the globes and intraorbital fat are  unremarkable. Other: None. IMPRESSION: No acute intracranial abnormality. Electronically Signed   By: Misty Russell M.D.   On: 01/28/2022 12:47  ? ?CT Soft Tissue Neck W Contrast ? ?Result Date: 01/28/2022 ?CLINICAL DATA:  Palate weakness with difficulty swallowing. Stiff neck for a few days EXAM: CT NECK WITH CONTRAST TECHNIQUE: Multidetector CT imaging of the neck was performed using the standard protocol following the bolus administration of intravenous contrast. RADIATION DOSE REDUCTION: This exam was performed according to the departmental dose-optimization program which includes automated exposure control, adjustment of the mA and/or kV according to patient size and/or  use of iterative reconstruction technique. CONTRAST:  23m OMNIPAQUE IOHEXOL 300 MG/ML  SOLN COMPARISON:  None Available. FINDINGS: Pharynx and larynx: Normal. No mass or swelling. Salivary glands: No inflammation, mass, or stone. Thyroid: Normal. Lymph nodes: None enlarged or abnormal density. Vascular: Negative. Limited intracranial: Negative. Visualized orbits: Negative Mastoids and visualized paranasal sinuses: Clear Skeleton: Advanced dental caries with diffuse deep erosions and periapical erosion. No acute finding Upper chest: Negative. IMPRESSION: No acute finding or explanation for symptoms. Electronically Signed   By: JJorje GuildM.D.   On: 01/28/2022 12:51  ? ?CT Abdomen Pelvis W Contrast ? ?Result Date: 01/28/2022 ?CLINICAL DATA:  Nonlocalized abdominal pain. EXAM: CT ABDOMEN AND PELVIS WITH CONTRAST TECHNIQUE: Multidetector CT imaging of the abdomen and pelvis was performed using the standard protocol following bolus administration of intravenous contrast. RADIATION DOSE REDUCTION: This exam was performed according to the departmental dose-optimization program which includes automated exposure control, adjustment of the mA and/or kV according to patient size and/or use of iterative reconstruction technique. CONTRAST:  779m OMNIPAQUE IOHEXOL 300 MG/ML  SOLN COMPARISON:  No comparison studies available. FINDINGS: Lower chest: Unremarkable Hepatobiliary: No suspicious focal abnormality within the liver parenchyma. Probable adenomyomatosis at the

## 2022-01-28 NOTE — ED Triage Notes (Signed)
Patient arrived with complaints of generalized body aches and pains. Patient states that her neck has been stiff for the last few days. Patient takes she took a goody powder and '800mg'$  of ibuprofen this morning without relief.  ?

## 2022-01-28 NOTE — Assessment & Plan Note (Signed)
Platelets 1557.  Quite elevated, but think this is at least in part reactive due to anemia. ?-Trend for now ?

## 2022-01-28 NOTE — Assessment & Plan Note (Addendum)
Generalized body aches with headaches and neck pain with stiffness.  Recent loss of 3 family members.  Patient very anxious, depressed reports panic attacks.  Possible psychosomatic etiology.  There was concern for meningitis in the ED considering patient's history.  Lumbar puncture was done, EDP reports sample was bloody.   ?-Follow-up on CSF analysis ?-Ativan 0.'5mg'$  every 6 hourly as needed ?

## 2022-01-28 NOTE — Assessment & Plan Note (Addendum)
Exact acuity undetermined as no baseline on file.  But likely chronic anemia.  She is symptomatic also with dyspnea on exertion and generalized fatigue.  Hgb 6.7.  She reports a history of iron deficiency anemia for which fusions were recommended, also history of Plummer-Vinson.  She has not seen a provider in at least 10 years.  No history of melena hematochezia or hematemesis.  Significant NSAID use in the past 3 days, but denies significant chronic use. ?-Transfuse 2 units PRBC ?-IV Lasix 20 mg x 1 ?-Anemia panel shows low serum iron at 17, increased TIBC at 630, low iron sat situation at 3, very low ferritin at 1.  Normal B12 and folate. ?-Stool occult ?- Follow up Retic count ?-GI to see in a.m. ?-IV Protonix 40 daily ? ?

## 2022-01-28 NOTE — ED Notes (Signed)
Attempted IV access x 2 without success.

## 2022-01-28 NOTE — Assessment & Plan Note (Signed)
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  ?- Mechnaical soft diet, she reports she does well with this ?-GI to see in a.m. ?- NPO midnight ?

## 2022-01-28 NOTE — H&P (Addendum)
?History and Physical  ? ? ?Tanya Stout WYO:[id number] DOB: 05-31-1966 DOA: 01/28/2022 ? ?PCP: Pcp, No Patient coming from: Home ? ?I have personally briefly reviewed patient's old medical records in [place] ? ?Chief Complaint: Body aches, headache  ? ?HPI: Tanya Stout is a 56 y.o. female with medical history significant for anemia, breast and cervical cancer. ?Patient presented to the ED with multiple complaints including - body aches of 3 days duration.  She reports headache and neck stiffness over the past 3 days which is worse in severity compared to the aches in her body.  She reports everything hurts even her toenails.  Patient reports 2 weeks ago she lost her father, and within a 5-day, she lost a total of 3 family members.  History of depression, and panic attacks.  The time she saw a doctor was at least 10 years ago.  ?She reports a history of anemia, and family history of same in her mother and grandmother.  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  She reports for a long time she has felt generalized fatigue, dyspnea with exertion, but attributed this to her depression. ?Over the past 3 days she has taken at least 8 doses of Goody powder alternating with ibuprofen.  But she reports at baseline she takes maybe 1 or 2 doses of Goody powder in a month.  She reports mild chronic pain to the right side of her abdomen, she denies vomiting, no black stools, no blood in stools.  She is not a vegetarian.   ? ?Patient also very anxious in my evaluation, crying intermittently, tells me she feels like she is having panic attacks, she is very worried that she has no insurance, and what she is going to have to pay for. With 3 family members that just passed away, she is very anxious that she is in the hospital, and her father died in the hospital (another facility )about 2 weeks ago.  She reports a history of meningitis  very many years ago and reports her headache and neck pain feels similar to that.  She denies fevers.  Denies urinary symptoms. ? ?She reports in the past she used to get her esophagus stretched.  She has not had that done in many years.  She reports difficulty swallowing firm solids but does okay with liquids and soft diet like mashed potatoes. ? ?ED Course: Temperature 98.4.  Heart rate mostly 60s.  Respiratory rate 10-20.  Blood pressure systolic 1 teens to 774J. ?Hemoglobin 6.7.  UA shows moderate leukocytes.  Lactic acid 1.7 > 1.4.  Head and cervical CT without acute abnormality.  CT abdomen and pelvis with contrast suggest constipation, doubt other acute abnormality. ?Patient's history of meningitis and reporting similar symptoms years ago, lumbar puncture was done in the ED, but was bloody, but was sent for analysis and cultures. ?2 units PRBC ordered. ?Zofran, Dilaudid 1 mg, Reglan 10 mg given in the ED with significant improvement in patient's headache and neck stiffness.   ?IV vancomycin and ceftriaxone and 10 mg dexamethasone also given for possible meningitis. ? ?Review of Systems: As per HPI all other systems reviewed and negative. ? ?Past Medical History:  ?Diagnosis Date  ? Anemia   ? Breast cancer ([place])   ? Cervical cancer ([place])   ? Meningitis   ? "its been years ago"  ? ? ? reports that she has never  smoked. She has never used smokeless tobacco. She reports that she does not currently use alcohol. She reports current drug use. Drug: Marijuana. ? ?Allergies  ?Allergen Reactions  ? Morphine Itching  ? Penicillins Itching  ? ?Family history of anemia. ? ?Prior to Admission medications   ?Not on File  ? ? ?Physical Exam: ?Vitals:  ? 01/28/22 1030 01/28/22 1240 01/28/22 1400 01/28/22 1520  ?BP: 128/81 122/73 128/79 125/76  ?Pulse: 66 63 68 73  ?Resp: '13 13 20 15  '$ ?Temp:    98.3 ?F (36.8 ?C)  ?TempSrc:    Oral  ?SpO2: 100% 100% 100% 100%  ?Weight:      ?Height:      ? ? ?Constitutional: Very anxious,  tearful, ?Vitals:  ? 01/28/22 1030 01/28/22 1240 01/28/22 1400 01/28/22 1520  ?BP: 128/81 122/73 128/79 125/76  ?Pulse: 66 63 68 73  ?Resp: '13 13 20 15  '$ ?Temp:    98.3 ?F (36.8 ?C)  ?TempSrc:    Oral  ?SpO2: 100% 100% 100% 100%  ?Weight:      ?Height:      ? ?Eyes: PERRL, lids and conjunctivae normal ?ENMT: Mucous membranes are moist.  Tongue very smooth without rugae, lips inflamed-mucositis, angular cheilitis. ?Neck: normal, supple, no masses, no thyromegaly ?Respiratory: clear to auscultation bilaterally, no wheezing, no crackles. Normal respiratory effort. No accessory muscle use.  ?Cardiovascular: Regular rate and rhythm, 3/6 systolic murmur murmurs- patient unaware of she has hx of sam, no / rubs / gallops. No extremity edema. 2+ pedal pulses.   ?Abdomen: no tenderness, no masses palpated. No hepatosplenomegaly. Bowel sounds positive.  ?Musculoskeletal: no clubbing / cyanosis.  Good ROM, no contractures. Normal muscle tone.  Bilateral feet appear to have increased plantarflexion at rest L >R, when patient is lying in bed.  She reports falls and thinks this may be new. ?Skin: no rashes, lesions, ulcers. No induration ?Neurologic: 4/5 strength in all extremities no apparent cranial nerve abnormality.  ?Psychiatric: Alert and oriented x4, very anxious, crying,   ? ?Labs on Admission: I have personally reviewed following labs and imaging studies ? ?CBC: ?Recent Labs  ?Lab 01/28/22 ?0830  ?WBC 11.3*  ?NEUTROABS 7.3  ?HGB 6.7*  ?HCT 27.7*  ?MCV 58.4*  ?PLT 1,557*  ? ?Basic Metabolic Panel: ?Recent Labs  ?Lab 01/28/22 ?0830  ?NA 139  ?K 3.9  ?CL 107  ?CO2 25  ?GLUCOSE 82  ?BUN 14  ?CREATININE 0.77  ?CALCIUM 9.1  ? ?GFR: ?Estimated Creatinine Clearance: 69.8 mL/min (by C-G formula based on SCr of 0.77 mg/dL). ?Liver Function Tests: ?Recent Labs  ?Lab 01/28/22 ?0830  ?AST 24  ?ALT 14  ?ALKPHOS 55  ?BILITOT 0.1*  ?PROT 8.1  ?ALBUMIN 4.2  ? ?Recent Labs  ?Lab 01/28/22 ?0830  ?LIPASE 58*  ? ?Urine analysis: ?   ?Component  Value Date/Time  ? [place] 01/28/2022 1012  ? APPEARANCEUR HAZY (A) 01/28/2022 1012  ? LABSPEC 1.012 01/28/2022 1012  ? PHURINE 5.0 01/28/2022 1012  ? GLUCOSEU NEGATIVE 01/28/2022 1012  ? HGBUR NEGATIVE 01/28/2022 1012  ? BILIRUBINUR NEGATIVE 01/28/2022 1012  ? [place] NEGATIVE 01/28/2022 1012  ? PROTEINUR NEGATIVE 01/28/2022 1012  ? NITRITE NEGATIVE 01/28/2022 1012  ? LEUKOCYTESUR MODERATE (A) 01/28/2022 1012  ? ? ?Radiological Exams on Admission: ?CT Head Wo Contrast ? ?Result Date: 01/28/2022 ?CLINICAL DATA:  Sudden onset severe headache. EXAM: CT HEAD WITHOUT CONTRAST TECHNIQUE: Contiguous axial images were obtained from the base of the skull through the vertex without intravenous  contrast. RADIATION DOSE REDUCTION: This exam was performed according to the departmental dose-optimization program which includes automated exposure control, adjustment of the mA and/or kV according to patient size and/or use of iterative reconstruction technique. COMPARISON:  None Available. FINDINGS: Brain: There is no evidence for acute hemorrhage, hydrocephalus, mass lesion, or abnormal extra-axial fluid collection. No definite CT evidence for acute infarction. Vascular: No hyperdense vessel or unexpected calcification. Skull: No evidence for fracture. No worrisome lytic or sclerotic lesion. Sinuses/Orbits: The visualized paranasal sinuses and mastoid air cells are clear. Visualized portions of the globes and intraorbital fat are unremarkable. Other: None. IMPRESSION: No acute intracranial abnormality. Electronically Signed   By: Misty Pelster M.D.   On: 01/28/2022 12:47  ? ?CT Soft Tissue Neck W Contrast ? ?Result Date: 01/28/2022 ?CLINICAL DATA:  Palate weakness with difficulty swallowing. Stiff neck for a few days EXAM: CT NECK WITH CONTRAST TECHNIQUE: Multidetector CT imaging of the neck was performed using the standard protocol following the bolus administration of intravenous contrast. RADIATION DOSE REDUCTION:  This exam was performed according to the departmental dose-optimization program which includes automated exposure control, adjustment of the mA and/or kV according to patient size and/or use of iterative reco

## 2022-01-28 NOTE — ED Notes (Signed)
Pt ambulated to bathroom tolerated well

## 2022-01-28 NOTE — Assessment & Plan Note (Signed)
UA with positive leukocytes.  Rules out for sepsis.  WBC 11.3.  Lactic acid 1.7 > 1.4.  She has multiple complaints including flank pain, generalized body aches.   ?-Will treat with IV Rocephin 1 g daily  ?-Add on urine culture  ?

## 2022-01-29 ENCOUNTER — Inpatient Hospital Stay (HOSPITAL_COMMUNITY): Payer: Self-pay

## 2022-01-29 ENCOUNTER — Encounter (HOSPITAL_COMMUNITY): Payer: Self-pay | Admitting: Internal Medicine

## 2022-01-29 DIAGNOSIS — R011 Cardiac murmur, unspecified: Secondary | ICD-10-CM

## 2022-01-29 LAB — BPAM RBC
Blood Product Expiration Date: 202305242359
Blood Product Expiration Date: 202305252359
ISSUE DATE / TIME: 202305141503
ISSUE DATE / TIME: 202305141834
Unit Type and Rh: 9500
Unit Type and Rh: 9500

## 2022-01-29 LAB — ECHOCARDIOGRAM COMPLETE
AR max vel: 1.74 cm2
AV Area VTI: 1.59 cm2
AV Area mean vel: 1.58 cm2
AV Mean grad: 10 mmHg
AV Peak grad: 18.8 mmHg
Ao pk vel: 2.17 m/s
Area-P 1/2: 4.31 cm2
Height: 64 in
S' Lateral: 3.3 cm
Weight: 1920 oz

## 2022-01-29 LAB — CBC
HCT: 30.7 % — ABNORMAL LOW (ref 36.0–46.0)
Hemoglobin: 8.5 g/dL — ABNORMAL LOW (ref 12.0–15.0)
MCH: 17.5 pg — ABNORMAL LOW (ref 26.0–34.0)
MCHC: 27.7 g/dL — ABNORMAL LOW (ref 30.0–36.0)
MCV: 63 fL — ABNORMAL LOW (ref 80.0–100.0)
Platelets: 1342 10*3/uL (ref 150–400)
RBC: 4.87 MIL/uL (ref 3.87–5.11)
RDW: 29.9 % — ABNORMAL HIGH (ref 11.5–15.5)
WBC: 13.1 10*3/uL — ABNORMAL HIGH (ref 4.0–10.5)
nRBC: 2.9 % — ABNORMAL HIGH (ref 0.0–0.2)

## 2022-01-29 LAB — T4, FREE: Free T4: 0.72 ng/dL (ref 0.61–1.12)

## 2022-01-29 LAB — TYPE AND SCREEN
ABO/RH(D): O NEG
Antibody Screen: NEGATIVE
Unit division: 0
Unit division: 0

## 2022-01-29 LAB — BASIC METABOLIC PANEL
Anion gap: 10 (ref 5–15)
BUN: 13 mg/dL (ref 6–20)
CO2: 26 mmol/L (ref 22–32)
Calcium: 9.1 mg/dL (ref 8.9–10.3)
Chloride: 104 mmol/L (ref 98–111)
Creatinine, Ser: 0.89 mg/dL (ref 0.44–1.00)
GFR, Estimated: >60 mL/min (ref >60)
Glucose, Bld: 97 mg/dL (ref 70–99)
Potassium: 3.8 mmol/L (ref 3.5–5.1)
Sodium: 140 mmol/L (ref 135–145)

## 2022-01-29 LAB — PATHOLOGIST SMEAR REVIEW

## 2022-01-29 LAB — TSH: TSH: 1.034 u[IU]/mL (ref 0.350–4.500)

## 2022-01-29 LAB — HIV ANTIBODY (ROUTINE TESTING W REFLEX): HIV Screen 4th Generation wRfx: NONREACTIVE

## 2022-01-29 IMAGING — MR MR HEAD W/O CM
12 of 13 series · 41 of 48 positions shown · non-contrast
Comparison: Prior MRI, correlation is made with CT head [DATE]

CLINICAL DATA: Headache

EXAM:
MRI HEAD WITHOUT CONTRAST
TECHNIQUE: Multiplanar, multiecho pulse sequences of the brain and surrounding
structures were obtained without intravenous contrast.

[Series 5: DWI · axial · 4.0mm · 0.88mm/px · z∈[-129,+6]mm · 5 of 36 slices shown (1 of 6)]
[im 1/36]
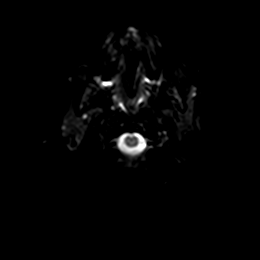
[im 9/36]
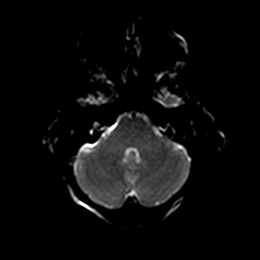
[im 18/36]
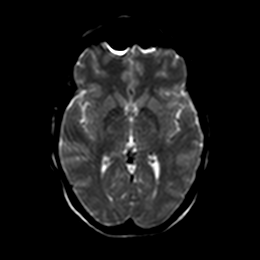
[im 27/36]
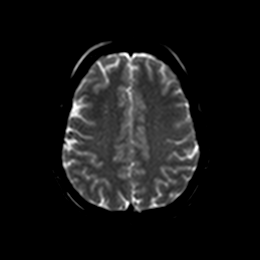
[im 36/36]
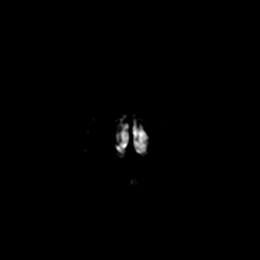

[Series 5: DWI · axial · 4.0mm · 0.88mm/px · z∈[-129,+6]mm · 5 of 36 slices shown (2 of 6)]
[im 1/36]
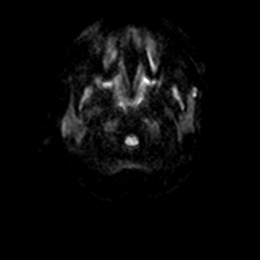
[im 9/36]
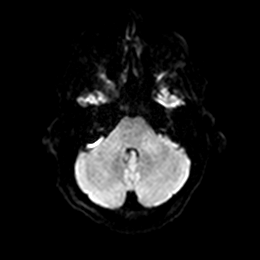
[im 18/36]
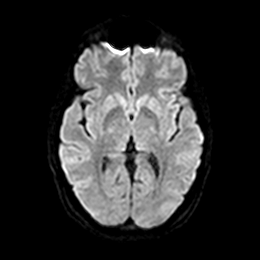
[im 27/36]
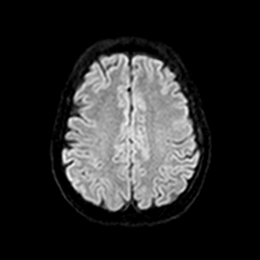
[im 36/36]
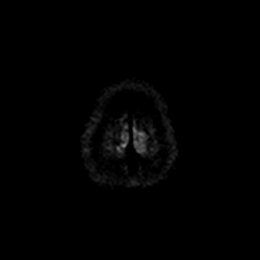

[Series 6: DWI · axial · 4.0mm · 0.88mm/px · z∈[-129,+6]mm · 4 of 36 slices shown (3 of 6)]
[im 1/36]
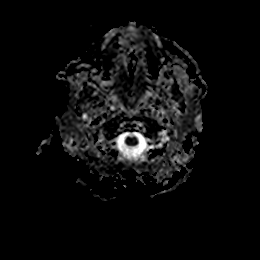
[im 12/36]
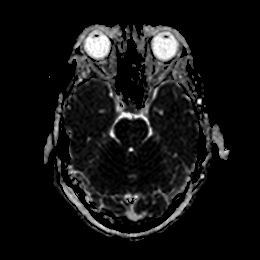
[im 24/36]
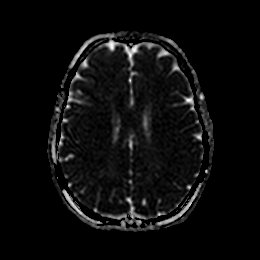
[im 36/36]
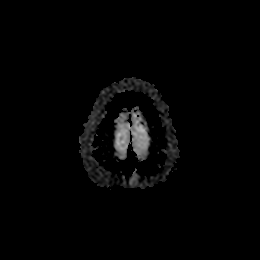

[Series 7: DWI · coronal · 5.0mm · 0.88mm/px · 3 of 28 slices shown (4 of 6)]
[im 1/28]
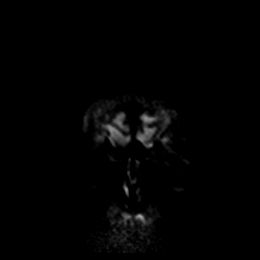
[im 14/28]
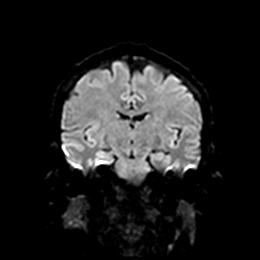
[im 28/28]
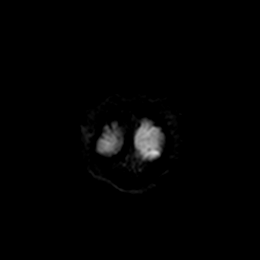

[Series 7: DWI · coronal · 5.0mm · 0.88mm/px · 3 of 28 slices shown (5 of 6)]
[im 1/28]
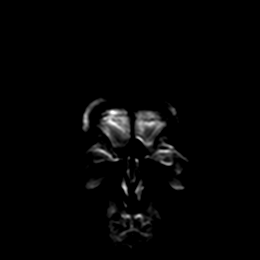
[im 14/28]
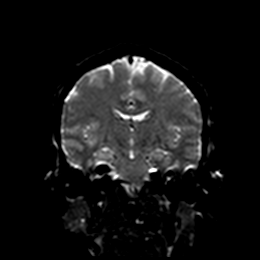
[im 28/28]
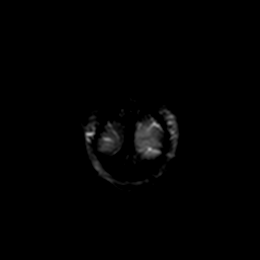

[Series 8: DWI · coronal · 5.0mm · 0.88mm/px · 3 of 28 slices shown (6 of 6)]
[im 1/28]
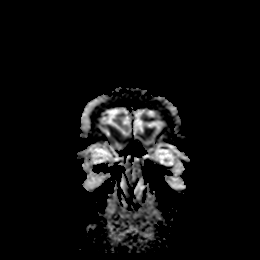
[im 14/28]
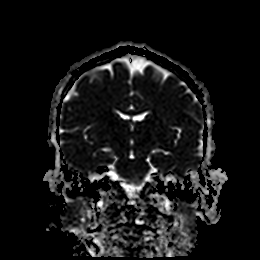
[im 28/28]
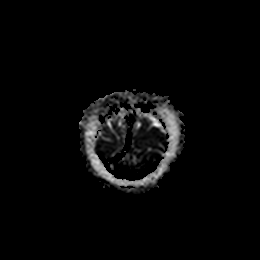

[Series 9: T1 · sagittal · 5.0mm · 0.94mm/px · 2 of 19 slices shown]
[im 1/19]
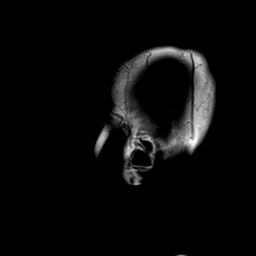
[im 19/19]
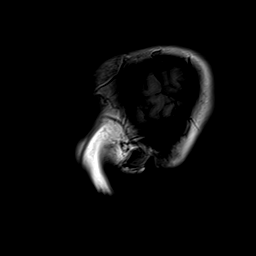

[Series 10: T2 · axial · 5.0mm · 0.72mm/px · z∈[-133,+10]mm · 3 of 22 slices shown (1 of 2)]
[im 1/22]
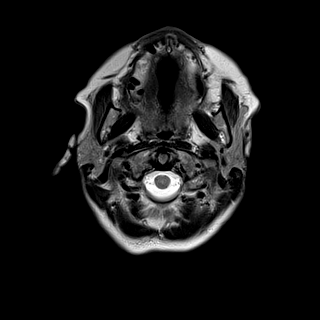
[im 11/22]
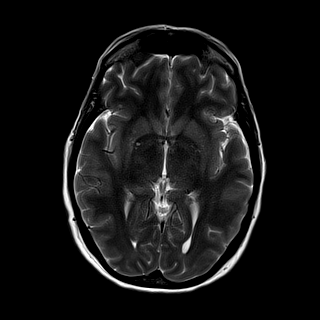
[im 22/22]
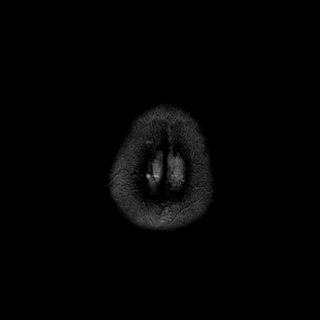

[Series 11: ax hemo · axial · 5.0mm · 0.86mm/px · z∈[-128,+12]mm · 3 of 25 slices shown]
[im 1/25]
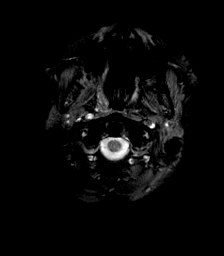
[im 13/25]
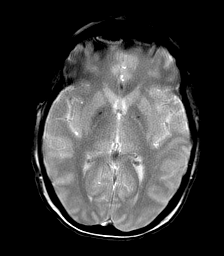
[im 25/25]
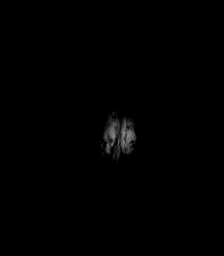

[Series 12: FLAIR · axial · 4.0mm · 0.43mm/px · z∈[-130,+14]mm · 5 of 38 slices shown (1 of 2)]
[im 1/38]
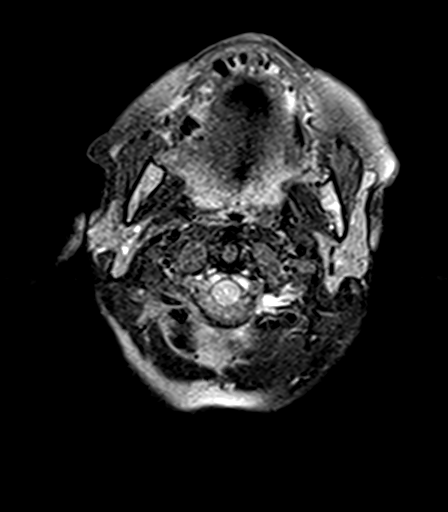
[im 10/38]
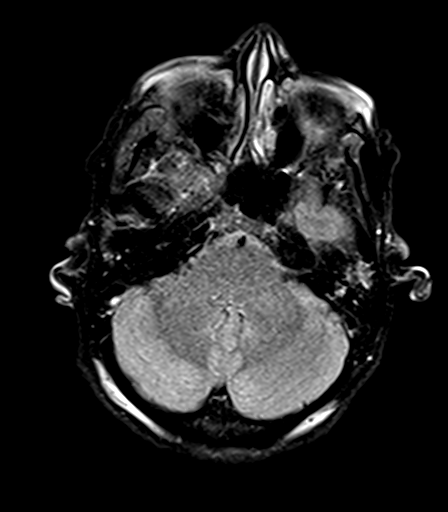
[im 19/38]
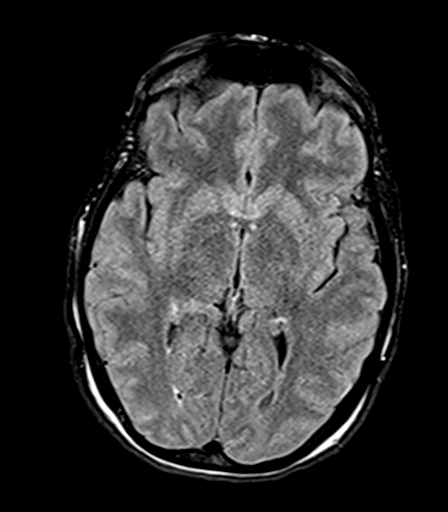
[im 28/38]
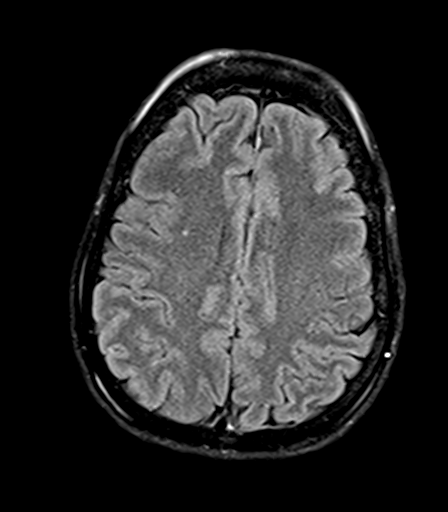
[im 38/38]
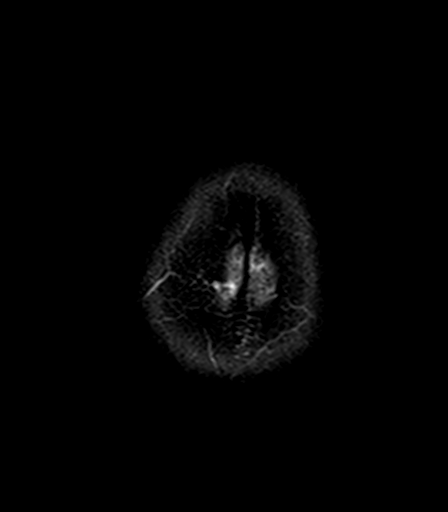

[Series 14: T2 · coronal · 5.0mm · 0.72mm/px · 3 of 24 slices shown (2 of 2)]
[im 1/24]
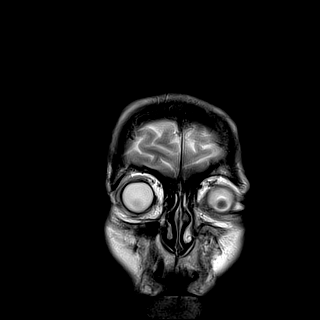
[im 12/24]
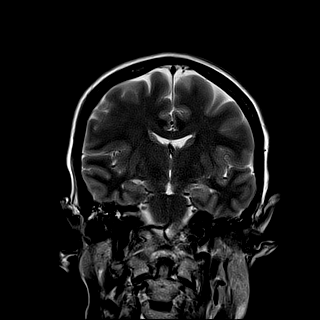
[im 24/24]
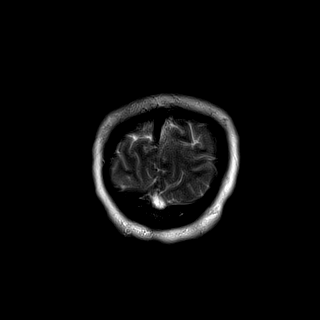

[Series 15: FLAIR · sagittal · 5.0mm · 0.94mm/px · 2 of 18 slices shown (2 of 2)]
[im 1/18]
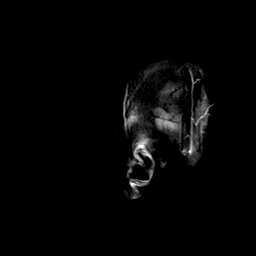
[im 18/18]
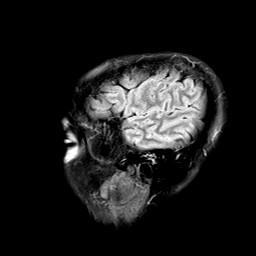

[41 of 48 positions shown; findings below may reference images not displayed]

FINDINGS: Brain: No restricted diffusion to suggest acute or subacute infarct.
No acute hemorrhage, mass, mass effect, or midline shift. No
hemosiderin deposition to suggest remote hemorrhage. Scattered T2
hyperintense signal in the periventricular white matter, which are
nonspecific but likely the sequela of mild chronic small vessel
ischemic disease. No juxtacortical or infratentorial T2 hyperintense
foci.

Vascular: Normal flow voids.

Skull and upper cervical spine: Normal marrow signal.

Sinuses/Orbits: Negative.

Other: Trace fluid in the mastoid air cells.
IMPRESSION: 1. No acute intracranial process. No etiology seen for the patient's
headaches.
2. Scattered T2 hyperintense foci in the periventricular white
matter, which are nonspecific but likely the sequela of mild chronic
small vessel ischemic disease. These are not in a pattern suggestive
of demyelination.
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
## 2022-01-29 IMAGING — MR MR CERVICAL SPINE W/O CM
5 series · 34 of 48 positions shown · non-contrast
Comparison: CT neck [DATE].

CLINICAL DATA: Neck pain, acute, known malignancy

EXAM:
MRI CERVICAL SPINE WITHOUT CONTRAST
TECHNIQUE: Multiplanar, multisequence MR imaging of the cervical spine was
performed. No intravenous contrast was administered.

[Series 1: csp t2_tse_sag_fast · sagittal · 3.0mm · 0.43mm/px · 6 of 15 slices shown]
[im 1/15]
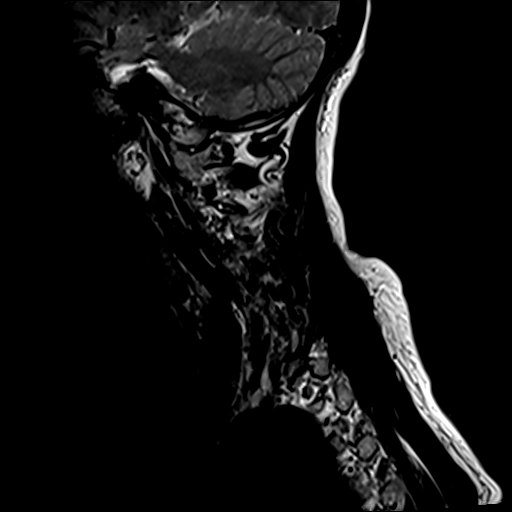
[im 3/15]
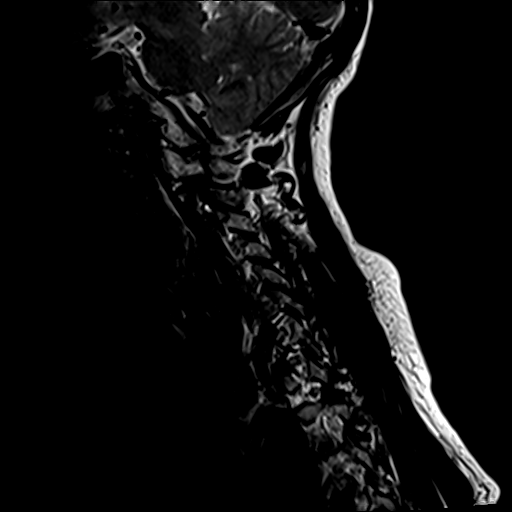
[im 6/15]
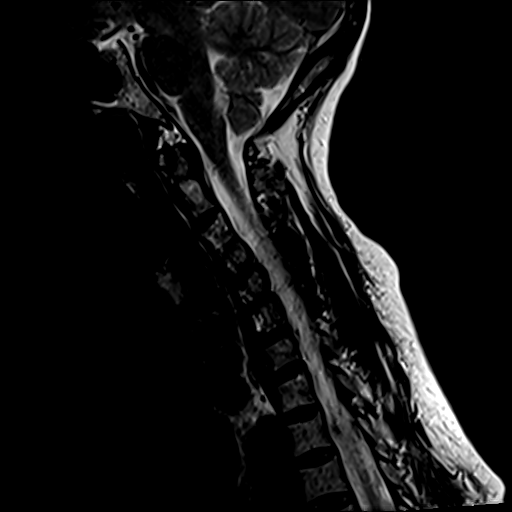
[im 9/15]
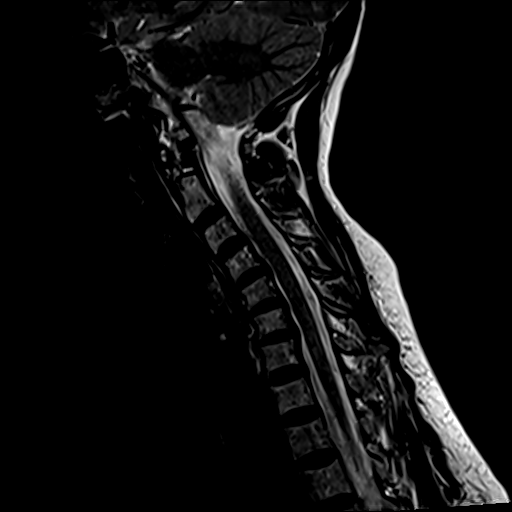
[im 12/15]
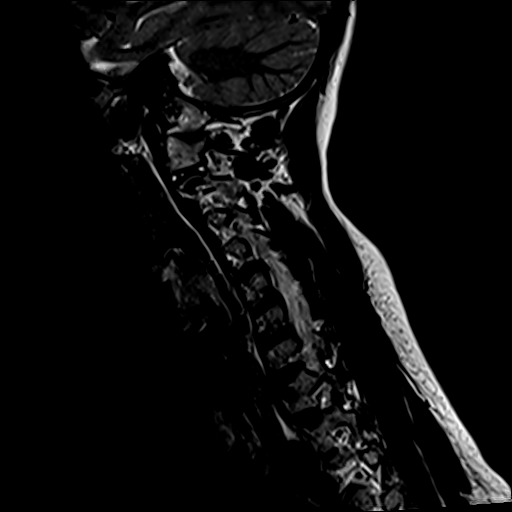
[im 15/15]
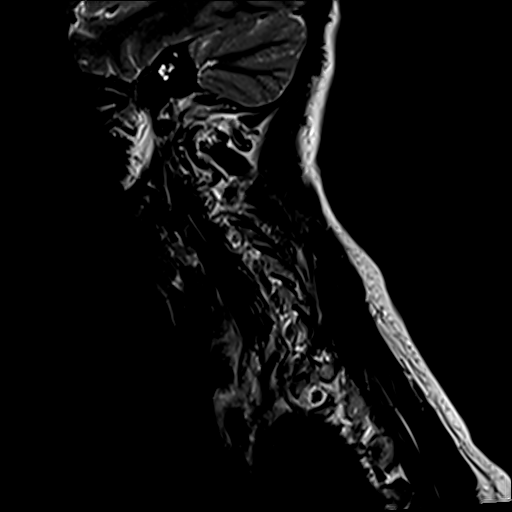

[Series 2: csp t1_tse_sag_fast · sagittal · 3.0mm · 0.43mm/px · 7 of 15 slices shown]
[im 1/15]
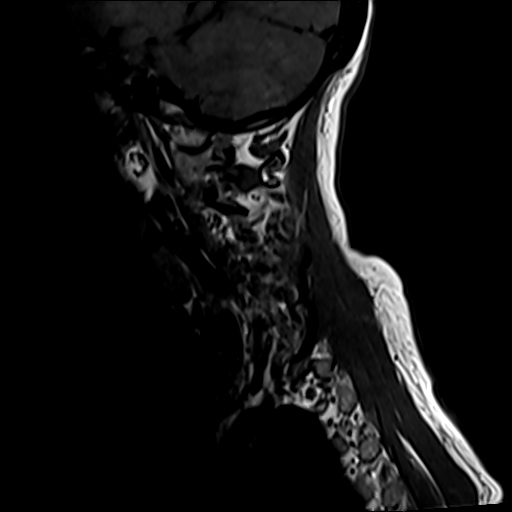
[im 3/15]
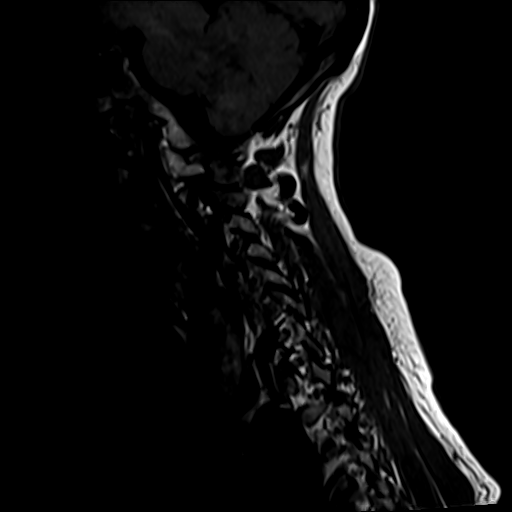
[im 5/15]
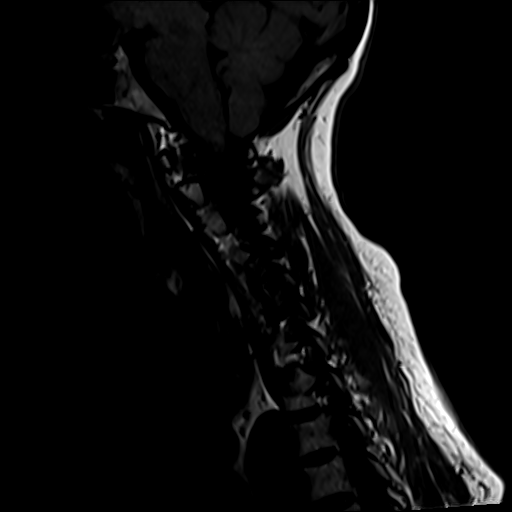
[im 8/15]
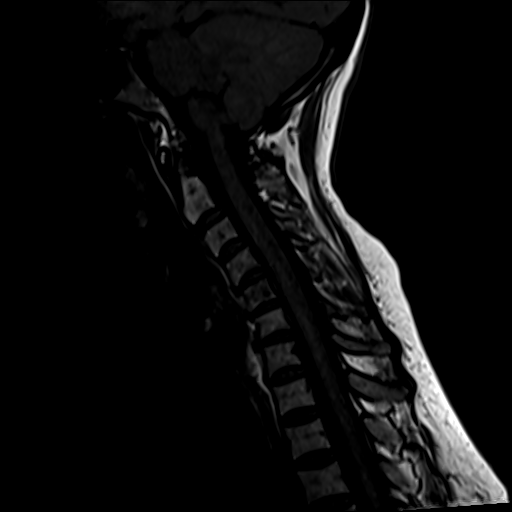
[im 10/15]
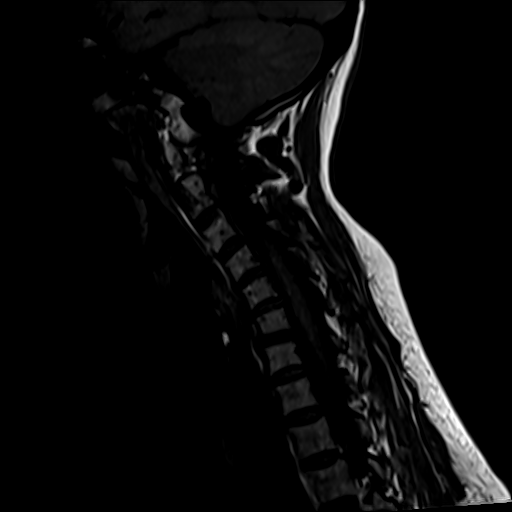
[im 12/15]
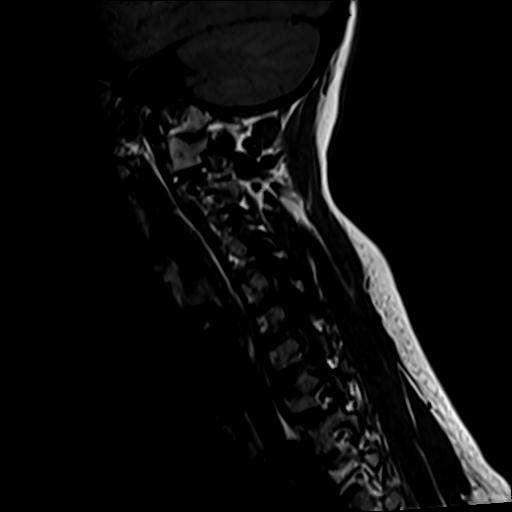
[im 15/15]
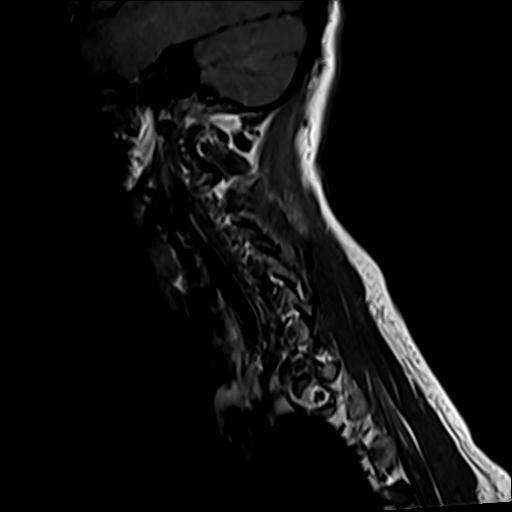

[Series 3: STIR · sagittal · 3.0mm · 0.86mm/px · 7 of 15 slices shown]
[im 1/15]
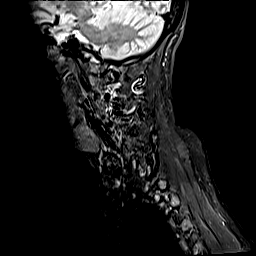
[im 3/15]
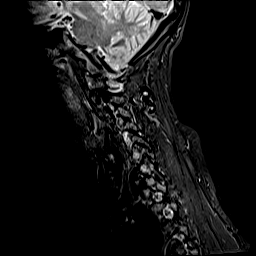
[im 5/15]
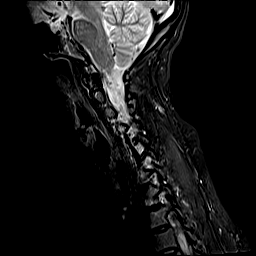
[im 8/15]
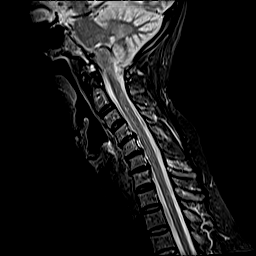
[im 10/15]
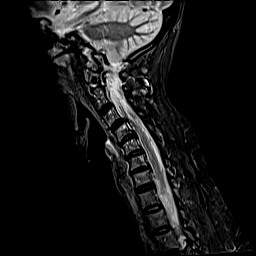
[im 12/15]
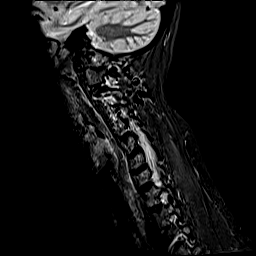
[im 15/15]
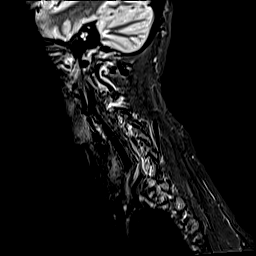

[Series 4: csp t2_tse_tra_fast · axial · 3.0mm · 0.78mm/px · z∈[-78,+10]mm · 8 of 29 slices shown]
[im 1/29]
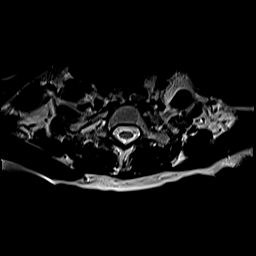
[im 5/29]
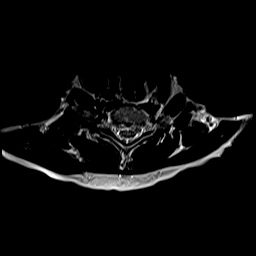
[im 9/29]
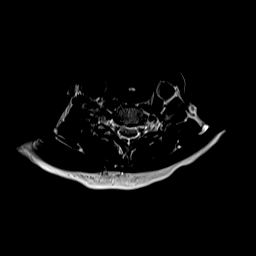
[im 13/29]
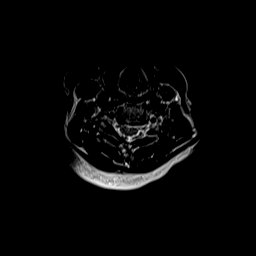
[im 16/29]
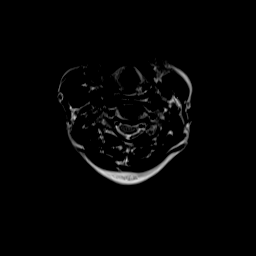
[im 20/29]
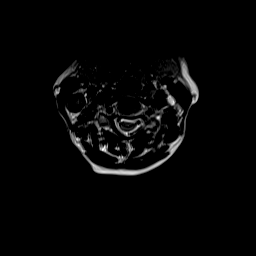
[im 24/29]
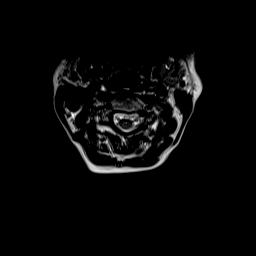
[im 29/29]
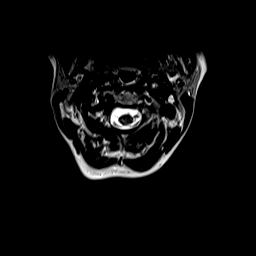

[Series 5: GRE · axial · 3.0mm · 0.78mm/px · z∈[-78,-19]mm · 6 of 29 slices shown]
[im 1/29]
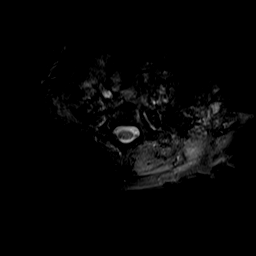
[im 5/29]
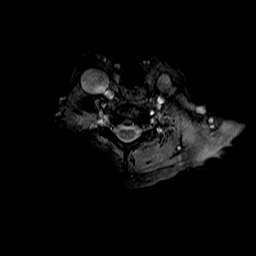
[im 9/29]
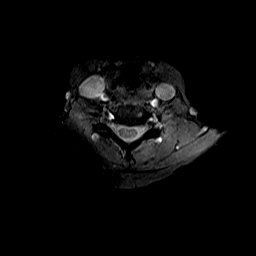
[im 13/29]
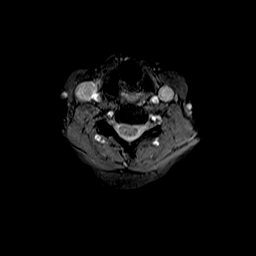
[im 16/29]
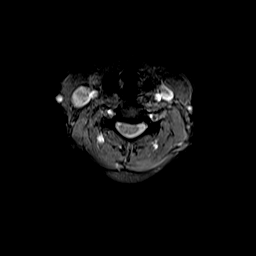
[im 20/29]
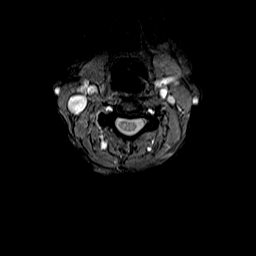

[34 of 48 positions shown; findings below may reference images not displayed]

FINDINGS: Alignment: Reversal of the normal cervical lordosis. No substantial
sagittal subluxation.

Vertebrae: Vertebral body heights are maintained. No focal marrow
edema to suggest acute fracture discitis/osteomyelitis. No
suspicious bone lesions.

Cord: Normal cord signal.

Posterior Fossa, vertebral arteries, paraspinal tissues: Visualized
vertebral artery flow voids are maintained.

Disc levels:

Motion limited.  Within this limitation:

C2-C3: No significant disc protrusion, foraminal stenosis, or canal
stenosis.

C3-C4: No significant disc protrusion, foraminal stenosis, or canal
stenosis.

C4-C5: Posterior disc osteophyte complex without significant canal
or foraminal stenosis.

C5-C6: Posterior disc/osteophyte complex without significant canal
or foraminal stenosis.

C6-C7: Posterior disc/osteophyte complex without significant canal
or foraminal stenosis.

C7-T1: No significant disc protrusion, foraminal stenosis, or canal
stenosis.
IMPRESSION: Mild multilevel degenerative change without significant stenosis

## 2022-01-29 MED ORDER — HYDROXYZINE HCL 25 MG PO TABS
25.0000 mg | ORAL_TABLET | Freq: Three times a day (TID) | ORAL | Status: DC | PRN
  Administered 2022-01-30 (×2): 25 mg via ORAL
  Filled 2022-01-29 (×2): qty 1

## 2022-01-29 MED ORDER — DEXAMETHASONE SODIUM PHOSPHATE 10 MG/ML IJ SOLN
8.0000 mg | Freq: Once | INTRAMUSCULAR | Status: AC
  Administered 2022-01-29: 8 mg via INTRAVENOUS
  Filled 2022-01-29: qty 1

## 2022-01-29 MED ORDER — KETOROLAC TROMETHAMINE 15 MG/ML IJ SOLN
15.0000 mg | Freq: Four times a day (QID) | INTRAMUSCULAR | Status: DC | PRN
  Administered 2022-01-29 (×2): 15 mg via INTRAVENOUS
  Filled 2022-01-29 (×2): qty 1

## 2022-01-29 MED ORDER — FENTANYL CITRATE PF 50 MCG/ML IJ SOSY
12.5000 ug | PREFILLED_SYRINGE | INTRAMUSCULAR | Status: DC | PRN
  Administered 2022-01-29: 12.5 ug via INTRAVENOUS
  Filled 2022-01-29: qty 1

## 2022-01-29 MED ORDER — PEG 3350-KCL-NA BICARB-NACL 420 G PO SOLR
4000.0000 mL | Freq: Once | ORAL | Status: AC
  Administered 2022-01-29: 4000 mL via ORAL

## 2022-01-29 MED ORDER — SODIUM CHLORIDE 0.9 % IV SOLN
250.0000 mg | Freq: Every day | INTRAVENOUS | Status: DC
  Administered 2022-01-29: 250 mg via INTRAVENOUS
  Filled 2022-01-29 (×2): qty 20

## 2022-01-29 MED ORDER — DIPHENHYDRAMINE HCL 50 MG/ML IJ SOLN
50.0000 mg | Freq: Once | INTRAMUSCULAR | Status: AC
  Administered 2022-01-29: 50 mg via INTRAVENOUS
  Filled 2022-01-29: qty 1

## 2022-01-29 MED ORDER — LORAZEPAM 2 MG/ML IJ SOLN
0.5000 mg | Freq: Once | INTRAMUSCULAR | Status: AC
Start: 2022-01-29 — End: 2022-01-29
  Administered 2022-01-29: 0.5 mg via INTRAVENOUS
  Filled 2022-01-29: qty 1

## 2022-01-29 MED ORDER — LORAZEPAM 2 MG/ML IJ SOLN
0.5000 mg | Freq: Once | INTRAMUSCULAR | Status: AC
  Administered 2022-01-29: 0.5 mg via INTRAVENOUS
  Filled 2022-01-29: qty 1

## 2022-01-29 MED ORDER — METHYLPREDNISOLONE SODIUM SUCC 125 MG IJ SOLR
125.0000 mg | Freq: Once | INTRAMUSCULAR | Status: AC
  Administered 2022-01-29: 125 mg via INTRAVENOUS
  Filled 2022-01-29: qty 2

## 2022-01-29 MED ORDER — SENNOSIDES-DOCUSATE SODIUM 8.6-50 MG PO TABS
2.0000 | ORAL_TABLET | Freq: Two times a day (BID) | ORAL | Status: DC
Start: 2022-01-29 — End: 2022-02-01
  Administered 2022-01-29 – 2022-01-30 (×4): 2 via ORAL
  Filled 2022-01-29 (×6): qty 2

## 2022-01-29 MED ORDER — DIPHENHYDRAMINE HCL 50 MG/ML IJ SOLN
25.0000 mg | Freq: Four times a day (QID) | INTRAMUSCULAR | Status: DC
  Administered 2022-01-29 – 2022-02-01 (×11): 25 mg via INTRAVENOUS
  Filled 2022-01-29 (×11): qty 1

## 2022-01-29 MED ORDER — LORATADINE 10 MG PO TABS
10.0000 mg | ORAL_TABLET | Freq: Every day | ORAL | Status: DC
  Administered 2022-01-29 – 2022-02-01 (×4): 10 mg via ORAL
  Filled 2022-01-29 (×4): qty 1

## 2022-01-29 MED ORDER — FENTANYL CITRATE PF 50 MCG/ML IJ SOSY
25.0000 ug | PREFILLED_SYRINGE | INTRAMUSCULAR | Status: DC | PRN
  Administered 2022-01-29 – 2022-01-30 (×3): 25 ug via INTRAVENOUS
  Filled 2022-01-29 (×3): qty 1

## 2022-01-29 MED ORDER — OXYCODONE-ACETAMINOPHEN 5-325 MG PO TABS
1.0000 | ORAL_TABLET | ORAL | Status: DC | PRN
  Administered 2022-01-29 – 2022-02-01 (×5): 1 via ORAL
  Filled 2022-01-29 (×5): qty 1

## 2022-01-29 MED ORDER — DIPHENHYDRAMINE HCL 50 MG/ML IJ SOLN
25.0000 mg | Freq: Four times a day (QID) | INTRAMUSCULAR | Status: DC | PRN
  Administered 2022-01-29: 25 mg via INTRAVENOUS
  Filled 2022-01-29: qty 1

## 2022-01-29 MED ORDER — LORAZEPAM 2 MG/ML IJ SOLN
0.5000 mg | Freq: Once | INTRAMUSCULAR | Status: DC | PRN
Start: 2022-01-29 — End: 2022-02-01

## 2022-01-29 MED ORDER — METHYLPREDNISOLONE SODIUM SUCC 125 MG IJ SOLR: 60.0000 mg | Freq: Every day | INTRAMUSCULAR | Status: DC

## 2022-01-29 MED ORDER — DEXAMETHASONE SODIUM PHOSPHATE 10 MG/ML IJ SOLN: 8.0000 mg | Freq: Once | INTRAMUSCULAR | Status: DC

## 2022-01-29 MED ORDER — HYDROMORPHONE HCL 1 MG/ML IJ SOLN
0.5000 mg | INTRAMUSCULAR | Status: DC | PRN
  Administered 2022-01-29 (×2): 0.5 mg via INTRAVENOUS
  Filled 2022-01-29 (×2): qty 0.5

## 2022-01-29 MED ORDER — FAMOTIDINE 20 MG PO TABS
20.0000 mg | ORAL_TABLET | Freq: Two times a day (BID) | ORAL | Status: DC
  Administered 2022-01-29: 20 mg via ORAL
  Filled 2022-01-29: qty 1

## 2022-01-29 MED ORDER — FAMOTIDINE IN NACL 20-0.9 MG/50ML-% IV SOLN
20.0000 mg | Freq: Two times a day (BID) | INTRAVENOUS | Status: DC
  Administered 2022-01-30 – 2022-02-01 (×5): 20 mg via INTRAVENOUS
  Filled 2022-01-29 (×5): qty 50

## 2022-01-29 MED ORDER — FLUOXETINE HCL 20 MG PO CAPS
20.0000 mg | ORAL_CAPSULE | Freq: Every day | ORAL | Status: DC
  Administered 2022-01-29 – 2022-02-01 (×4): 20 mg via ORAL
  Filled 2022-01-29 (×4): qty 1
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
## 2022-01-29 NOTE — Progress Notes (Signed)
?  Echocardiogram ?2D Echocardiogram has been performed. ? Tanya Stout ?01/29/2022, 1:48 PM ?

## 2022-01-29 NOTE — Progress Notes (Signed)
Patient given IV toradol at 2130 for pain. At 2210 patient c/o itching all over. IV benadryl given. R arm reddish in color compared to L. C/o tightness in the back of throat. MD Mansy notified. Received orders for scheduled IV benadryl, PO Pepcid and solu-medrol. Vitals checked. Stable at this time.  ? ?PRN pain meds Toradol and Percocet not giving patient relief. Patient requesting something stronger. Pain 10/10. MD Mansy notified. New order received for PRN Fentanyl. Will continue to monitor.  ?

## 2022-01-29 NOTE — Progress Notes (Signed)
?   01/29/22 1420  ?Vitals  ?BP 124/83  ?BP Location Right Arm  ?BP Method Automatic  ?Patient Position (if appropriate) Lying  ?Pulse Rate 96  ?Pulse Rate Source Dinamap  ?Resp 18  ?Level of Consciousness  ?Level of Consciousness Alert  ?MEWS COLOR  ?MEWS Score Color Green  ?Oxygen Therapy  ?SpO2 98 %  ?O2 Device Room Air  ?MEWS Score  ?MEWS Temp 0  ?MEWS Systolic 0  ?MEWS Pulse 0  ?MEWS RR 0  ?MEWS LOC 0  ?MEWS Score 0  ? ?Called to patient's room for c/o new welt/rash on both arms. Arrived to room to find pt with raised rash to bilateral posterior arms, chest, and right thigh. Pt also with noted facial swelling (eyes, lips), along with bilateral hand swelling. Pt states throat feels scratchy and eyes are itching. Lung sounds clear, no resp distress noted. HR regular apically. VSS as listed above. MD [place] Pink notified via [facility] page. ?MD returned page, advised of current pt condition, orders received. Pt with no IV access at this time. IV restarted after 4th attempt. Meds administered per order. Cool wash cloth applied to eyes for burning and itching. ?Continued to monitor pt and slowly noted decrease in reddness and size of welts. Pt states feels a little drowsy but A&O. No resp distress.  ?Repeat VS 138/98 (MAP 110), HR 105, Resp 20/min, SaO2 98% room air. Pt assisted to restroom by staff. Voided without difficulty. Back to bed.  ?

## 2022-01-29 NOTE — Progress Notes (Signed)
Per Garrison [place], FNP this pt has been psych cleared. This CSW will now remove pt from the [facility] shift report. TOC to assist and follow with discharge needs. ? ? ?Benjaman Kindler, MSW, LCSWA ?01/29/2022 10:15 PM ? ? ?

## 2022-01-29 NOTE — Progress Notes (Addendum)
? ?[facility] ?PROGRESS NOTE ? ? ?Tanya Stout NTZ:[id number] DOB: 1966/01/23 DOA: 01/28/2022 ? ?PCP: Pcp, No ? ?Brief History/Interval Summary: 56 y.o. female with medical history significant for anemia, breast and cervical cancer.  But apparently has not seen any medical provider in the last 10 years.  Presented to the ED with multiple complaints including body aches, headaches, neck stiffness.  Apparently lost multiple family members in the last few weeks including her father.  Has been feeling quite anxious sad.  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  Supposed to be on iron infusions.  Reported history of EGDs in the past was esophageal dilatation.  Patient was noted to have severe anemia with evidence for iron deficiency.  She was hospitalized for further management.  Due to concern for meningitis she underwent LP although it appears that the sample has clotted off.  Patient without any fever and no significant leukocytosis. ? ?Consultants: GI ? ?Procedures: LP was attempted in the ED but the sample has clotted off. ? ? ? ?Subjective/Interval History: ?Patient is crying at this time.  Complains of severe headache in the frontal part of the head.  She has been very sad and upset due to recent loss of family members.  Complains of low back pain as well.  Denies any chest pain shortness of breath. ? ? ? ?Assessment/Plan: ? ?ADDENDUM ?Called by RN around 2:20 PM that the patient was experiencing a allergic reaction with hives facial swelling and voice changes.  No stridor noted.  Saturations 100% on room air.  She received a dose of Dilaudid around 1:30 PM.  But has received 2 previous doses of Dilaudid without any problems.  Patient was also given ferric gluconate earlier today.  No other medications have been administered.  Ferric gluconate could be responsible for her reaction.  We will discontinue this medication.  Will give her Benadryl and  dexamethasone.  Monitor closely. ?Upon further review of meds, it looks like she also received Ceftriaxone today, at around 8:15AM. Unlikely that could have caused the reaction. She also received a dose of ceftriaxone yesterday without incident. ? ?Severe iron deficiency anemia ?Presented with a hemoglobin of 6.7. Patient was transfused 2 units of PRBC.  Hemoglobin responded to 8.5.  MCV 63.  Anemia panel was done which revealed ferritin of 1, iron of 17, TIBC 630.  Folic acid 8.4 and vitamin B12 359. ?We will also give her iron infusions. ? ?History of Plummer-Vinson/dysphagia ?Reports having had endoscopies previously.  Did report some dysphagia. ?Placed on PPI.  Looks like gastroenterology has been consulted.  She may benefit from endoscopy during this hospital stay. ? ?Urinary tract infection ?Abnormal UA was noted.  Follow-up on cultures.  Placed on ceftriaxone. Received 2 doses. Hold for now. ? ?Generalized body ache including headache neck pain and back pain ?Probably psychosomatic.  But she did have some weakness of the left lower extremity.  Patient is a poor historian.  She has good reflexes bilaterally.  She does have a history of breast and cervical cancer.  Since she is complaining of back pain we will go ahead with MRI of the cervical thoracic and lumbar spine. ?She underwent CT of the abdomen pelvis, CT of the head and CT of the neck soft tissue all of which did not show any acute or concerning findings.  Moderate stool volume was noted.  Place on laxatives. ?It appears that ED provider attempted an LP due to  her complaints of headache and neck stiffness.  She was afebrile without any significant elevation in her WBC.  CSF appears to have clotted off.  No CSF analysis could be performed.  No clear indication to repeat LP at this time as the suspicion for meningitis is very low.  Continue to monitor. ? ?Cardiac murmur ?Follow-up on echocardiogram ? ?Thrombocytosis ?Most likely reactive and secondary to  iron deficiency.  Continue to trend for now. ? ?Grief reaction ?Patient crying.  She apparently lost multiple family members recently.  We will request psychiatry to evaluate. ? ? ?DVT Prophylaxis: SCDs ?Code Status: Full code ?Family Communication: Discussed with patient ?Disposition Plan: Likely return home when improved ? ?Status is: Inpatient ?Remains inpatient appropriate because: Severe anemia, need for GI work-up ? ? ? ?Medications: Scheduled: ? pantoprazole (PROTONIX) IV  40 mg Intravenous Q24H  ? pneumococcal 20-valent conjugate vaccine  0.5 mL Intramuscular Tomorrow-1000  ? polyethylene glycol  17 g Oral BID  ? ?Continuous: ? ferric gluconate (FERRLECIT) IVPB    ? ?GPQ:DIYMEBRAXENMM **OR** acetaminophen, HYDROmorphone (DILAUDID) injection, ketorolac, LORazepam, LORazepam, oxyCODONE-acetaminophen ? ?Antibiotics: ?Anti-infectives (From admission, onward)  ? ? Start     Dose/Rate Route Frequency Ordered Stop  ? 01/29/22 1000  cefTRIAXone (ROCEPHIN) 1 g in sodium chloride 0.9 % 100 mL IVPB       ? 1 g ?200 mL/hr over 30 Minutes Intravenous  Once 01/28/22 1819 01/29/22 0844  ? 01/28/22 1030  cefTRIAXone (ROCEPHIN) 2 g in sodium chloride 0.9 % 100 mL IVPB       ? 2 g ?200 mL/hr over 30 Minutes Intravenous  Once 01/28/22 1025 01/28/22 1202  ? 01/28/22 1030  vancomycin (VANCOCIN) IVPB 1000 mg/200 mL premix       ? 1,000 mg ?200 mL/hr over 60 Minutes Intravenous  Once 01/28/22 1025 01/28/22 1632  ? ?  ? ? ?Objective: ? ?Vital Signs ? ?Vitals:  ? 01/28/22 2155 01/28/22 2259 01/29/22 0232 01/29/22 0647  ?BP: 112/68 121/78 106/62 137/69  ?Pulse: 74 75 (!) 57 68  ?Resp: '18 20 20 19  '$ ?Temp: 98.3 ?F (36.8 ?C) 98.6 ?F (37 ?C) 98 ?F (36.7 ?C) 98.2 ?F (36.8 ?C)  ?TempSrc: Oral Oral Oral Oral  ?SpO2: 98% 99% 100% 98%  ?Weight:      ?Height:      ? ? ?Intake/Output Summary (Last 24 hours) at 01/29/2022 1023 ?Last data filed at 01/29/2022 0900 ?Gross per 24 hour  ?Intake 2050.74 ml  ?Output 1 ml  ?Net 2049.74 ml  ? ?Filed  Weights  ? 01/28/22 0825  ?Weight: 54.4 kg  ? ? ?General appearance: Awake alert.  In no distress ?Resp: Clear to auscultation bilaterally.  Normal effort ?Cardio: S1-S2 is normal regular.  No S3-S4.  Systolic murmur appreciated over the precordium. ?GI: Abdomen is soft.  Nontender nondistended.  Bowel sounds are present normal.  No masses organomegaly ?Extremities: No edema.   ?Subtle weakness noted in the left lower extremity.  Straight leg raising test was negative. ?Neurologic: Alert and oriented x3.  Cranial nerves II to XII intact.  Motor strength equal bilateral upper extremities.  Weakness noted in the left lower extremity. ? ? ?Lab Results: ? ?Data Reviewed: I have personally reviewed following labs and reports of the imaging studies ? ?CBC: ?Recent Labs  ?Lab 01/28/22 ?0830 01/29/22 ?0450  ?WBC 11.3* 13.1*  ?NEUTROABS 7.3  --   ?HGB 6.7* 8.5*  ?HCT 27.7* 30.7*  ?MCV 58.4* 63.0*  ?PLT 1,557* 1,342*  ? ? ?  Basic Metabolic Panel: ?Recent Labs  ?Lab 01/28/22 ?0830 01/28/22 ?2232 01/29/22 ?0450  ?NA 139  --  140  ?K 3.9  --  3.8  ?CL 107  --  104  ?CO2 25  --  26  ?GLUCOSE 82  --  97  ?BUN 14  --  13  ?CREATININE 0.77  --  0.89  ?CALCIUM 9.1  --  9.1  ?MG  --  1.9  --   ? ? ?GFR: ?Estimated Creatinine Clearance: 62.8 mL/min (by C-G formula based on SCr of 0.89 mg/dL). ? ?Liver Function Tests: ?Recent Labs  ?Lab 01/28/22 ?0830  ?AST 24  ?ALT 14  ?ALKPHOS 55  ?BILITOT 0.1*  ?PROT 8.1  ?ALBUMIN 4.2  ? ? ?Recent Labs  ?Lab 01/28/22 ?0830  ?LIPASE 58*  ? ? ?Thyroid Function Tests: ?Recent Labs  ?  01/29/22 ?9021  ?TSH 1.034  ? ? ?Anemia Panel: ?Recent Labs  ?  01/28/22 ?0930 01/28/22 ?2232  ?VITAMINB12 359  --   ?FOLATE 8.4  --   ?FERRITIN 1*  --   ?TIBC 630*  --   ?IRON 17*  --   ?RETICCTPCT  --  0.4  ? ? ?Recent Results (from the past 240 hour(s))  ?Resp Panel by RT-PCR (Flu A&B, Covid) Nasopharyngeal Swab     Status: None  ? Collection Time: 01/28/22 10:24 AM  ? Specimen: Nasopharyngeal Swab; Nasopharyngeal(NP)  swabs in vial transport medium  ?Result Value Ref Range Status  ? SARS Coronavirus 2 by RT PCR NEGATIVE NEGATIVE Final  ?  Comment: (NOTE) ?SARS-CoV-2 target nucleic acids are NOT DETECTED. ? ?The SARS-CoV-2 RNA is

## 2022-01-29 NOTE — TOC Initial Note (Signed)
Transition of Care (TOC) - Initial/Assessment Note  ? ? ?Patient Details  ?Name: Tanya Stout ?MRN: [id number] ?Date of Birth: Apr 21, 1966 ? ?Transition of Care (TOC) CM/SW Contact:    ?Semiyah Newgent D, LCSW ?Phone Number: ?01/29/2022, 11:48 AM ? ?Clinical Narrative:                 ?Patient from home, new to the area per her report. Admitted for iron deficiency anemia. No insurance. States that she had Medicaid at one time, currently has no income. Discussed reapplying for services. Agreeable to [facility] referral. Referred to [place] with [facility] for PCP and medication assistance services.  ? ?Expected Discharge Plan: Home/Self Care ?Barriers to Discharge: Continued Medical Work up ? ? ?Patient Goals and CMS Choice ?Patient states their goals for this hospitalization and ongoing recovery are:: return home ?  ?  ? ?Expected Discharge Plan and Services ?Expected Discharge Plan: Home/Self Care ?  ?  ?  ?  ?                ?  ?  ?  ?  ?  ?  ?  ?  ?  ?  ? ?Prior Living Arrangements/Services ?  ?  ?  ?       ?  ?  ?  ?  ? ?Activities of Daily Living ?Home Assistive Devices/Equipment: None ?ADL Screening (condition at time of admission) ?Patient's cognitive ability adequate to safely complete daily activities?: Yes ?Is the patient deaf or have difficulty hearing?: No ?Does the patient have difficulty seeing, even when wearing glasses/contacts?: No ?Does the patient have difficulty concentrating, remembering, or making decisions?: No ?Patient able to express need for assistance with ADLs?: Yes ?Does the patient have difficulty dressing or bathing?: No ?Independently performs ADLs?: Yes (appropriate for developmental age) ?Does the patient have difficulty walking or climbing stairs?: No ?Weakness of Legs: Both ?Weakness of Arms/Hands: Both ? ?Permission Sought/Granted ?  ?  ?   ?   ?   ?   ? ?Emotional Assessment ?  ?  ?  ?  ?  ?  ? ?Admission diagnosis:  Neck stiffness [M43.6] ?Anemia [D64.9] ?Thrombocytosis  [D75.839] ?Acute nonintractable headache, unspecified headache type [R51.9] ?Anemia, unspecified type [D64.9] ?Iron deficiency anemia [D50.9] ?Patient Active Problem List  ? Diagnosis Date Noted  ? Body aches 01/28/2022  ? Depression 01/28/2022  ? UTI (urinary tract infection) 01/28/2022  ? Thrombocytosis 01/28/2022  ? Iron deficiency anemia 01/28/2022  ? Dysphagia 01/28/2022  ? Cardiac murmur 01/28/2022  ? ?PCP:  Pcp, No ?Pharmacy:   ?[facility] [place] [place] [place] [facility] [address] ?[address] ?[place] [address] ?Phone: [phone] Fax: [phone] ? ? ? ? ?Social Determinants of Health (SDOH) Interventions ?  ? ?Readmission Risk Interventions ?   ? View : No data to display.  ?  ?  ?  ? ? ? ?

## 2022-01-30 ENCOUNTER — Inpatient Hospital Stay (HOSPITAL_COMMUNITY): Payer: Self-pay | Admitting: Anesthesiology

## 2022-01-30 ENCOUNTER — Encounter (HOSPITAL_COMMUNITY)
Admission: EM | Disposition: A | Discharge status: Home / Self Care | Payer: Self-pay | Source: Home / Self Care | Attending: Internal Medicine

## 2022-01-30 ENCOUNTER — Encounter (HOSPITAL_COMMUNITY): Payer: Self-pay | Admitting: Internal Medicine

## 2022-01-30 DIAGNOSIS — D509 Iron deficiency anemia, unspecified: Secondary | ICD-10-CM

## 2022-01-30 DIAGNOSIS — R1314 Dysphagia, pharyngoesophageal phase: Secondary | ICD-10-CM

## 2022-01-30 DIAGNOSIS — Z539 Procedure and treatment not carried out, unspecified reason: Secondary | ICD-10-CM

## 2022-01-30 DIAGNOSIS — K222 Esophageal obstruction: Secondary | ICD-10-CM

## 2022-01-30 DIAGNOSIS — T7840XA Allergy, unspecified, initial encounter: Secondary | ICD-10-CM

## 2022-01-30 DIAGNOSIS — F418 Other specified anxiety disorders: Secondary | ICD-10-CM

## 2022-01-30 DIAGNOSIS — K449 Diaphragmatic hernia without obstruction or gangrene: Secondary | ICD-10-CM

## 2022-01-30 HISTORY — PX: FLEXIBLE SIGMOIDOSCOPY: SHX5431

## 2022-01-30 HISTORY — PX: ESOPHAGOGASTRODUODENOSCOPY (EGD) WITH PROPOFOL: SHX5813

## 2022-01-30 HISTORY — PX: BIOPSY: SHX5522

## 2022-01-30 LAB — COMPREHENSIVE METABOLIC PANEL
ALT: 9 U/L (ref 0–44)
AST: 18 U/L (ref 15–41)
Albumin: 3.7 g/dL (ref 3.5–5.0)
Alkaline Phosphatase: 45 U/L (ref 38–126)
Anion gap: 7 (ref 5–15)
BUN: 18 mg/dL (ref 6–20)
CO2: 27 mmol/L (ref 22–32)
Calcium: 9.1 mg/dL (ref 8.9–10.3)
Chloride: 107 mmol/L (ref 98–111)
Creatinine, Ser: 0.9 mg/dL (ref 0.44–1.00)
GFR, Estimated: >60 mL/min (ref >60)
Glucose, Bld: 124 mg/dL — ABNORMAL HIGH (ref 70–99)
Potassium: 3.7 mmol/L (ref 3.5–5.1)
Sodium: 141 mmol/L (ref 135–145)
Total Bilirubin: 0.7 mg/dL (ref 0.3–1.2)
Total Protein: 6.5 g/dL (ref 6.5–8.1)

## 2022-01-30 LAB — CBC
HCT: 30.9 % — ABNORMAL LOW (ref 36.0–46.0)
Hemoglobin: 8.5 g/dL — ABNORMAL LOW (ref 12.0–15.0)
MCH: 17.4 pg — ABNORMAL LOW (ref 26.0–34.0)
MCHC: 27.5 g/dL — ABNORMAL LOW (ref 30.0–36.0)
MCV: 63.2 fL — ABNORMAL LOW (ref 80.0–100.0)
Platelets: 1392 10*3/uL (ref 150–400)
RBC: 4.89 MIL/uL (ref 3.87–5.11)
RDW: 31.5 % — ABNORMAL HIGH (ref 11.5–15.5)
WBC: 20.1 10*3/uL — ABNORMAL HIGH (ref 4.0–10.5)
nRBC: 2.5 % — ABNORMAL HIGH (ref 0.0–0.2)

## 2022-01-30 SURGERY — BIOPSY
Anesthesia: General

## 2022-01-30 MED ORDER — FENTANYL CITRATE (PF) 100 MCG/2ML IJ SOLN
INTRAMUSCULAR | Status: DC | PRN
  Administered 2022-01-30: 50 ug via INTRAVENOUS

## 2022-01-30 MED ORDER — METHYLPREDNISOLONE SODIUM SUCC 40 MG IJ SOLR
40.0000 mg | Freq: Two times a day (BID) | INTRAMUSCULAR | Status: DC
  Administered 2022-01-30 – 2022-02-01 (×5): 40 mg via INTRAVENOUS
  Filled 2022-01-30 (×5): qty 1

## 2022-01-30 MED ORDER — SODIUM CHLORIDE FLUSH 0.9 % IV SOLN
INTRAVENOUS | Status: AC
  Filled 2022-01-30: qty 10

## 2022-01-30 MED ORDER — BUTALBITAL-APAP-CAFFEINE 50-325-40 MG PO TABS
1.0000 | ORAL_TABLET | Freq: Four times a day (QID) | ORAL | Status: DC | PRN
  Administered 2022-01-30 – 2022-02-01 (×6): 1 via ORAL
  Filled 2022-01-30 (×6): qty 1

## 2022-01-30 MED ORDER — MIDAZOLAM HCL 2 MG/2ML IJ SOLN
2.0000 mg | Freq: Once | INTRAMUSCULAR | Status: AC
  Administered 2022-01-30: 2 mg via INTRAVENOUS

## 2022-01-30 MED ORDER — PROPOFOL 10 MG/ML IV BOLUS
INTRAVENOUS | Status: DC | PRN
  Administered 2022-01-30 (×2): 50 mg via INTRAVENOUS
  Administered 2022-01-30: 100 mg via INTRAVENOUS

## 2022-01-30 MED ORDER — SODIUM CHLORIDE 0.45 % IV SOLN: INTRAVENOUS | Status: AC

## 2022-01-30 MED ORDER — MONTELUKAST SODIUM 10 MG PO TABS
10.0000 mg | ORAL_TABLET | Freq: Every day | ORAL | Status: DC
  Administered 2022-01-30 – 2022-01-31 (×2): 10 mg via ORAL
  Filled 2022-01-30 (×2): qty 1

## 2022-01-30 MED ORDER — PEG 3350-KCL-NA BICARB-NACL 420 G PO SOLR
4000.0000 mL | Freq: Once | ORAL | Status: AC
  Administered 2022-01-30: 4000 mL via ORAL

## 2022-01-30 MED ORDER — MIDAZOLAM HCL 2 MG/2ML IJ SOLN
INTRAMUSCULAR | Status: AC
  Filled 2022-01-30: qty 2

## 2022-01-30 MED ORDER — LIDOCAINE HCL (CARDIAC) PF 100 MG/5ML IV SOSY
PREFILLED_SYRINGE | INTRAVENOUS | Status: DC | PRN
Start: 2022-01-30 — End: 2022-01-30
  Administered 2022-01-30: 60 mg via INTRAVENOUS

## 2022-01-30 MED ORDER — MIDAZOLAM HCL 2 MG/2ML IJ SOLN
INTRAMUSCULAR | Status: DC | PRN
Start: 2022-01-30 — End: 2022-01-30
  Administered 2022-01-30: 2 mg via INTRAVENOUS

## 2022-01-30 MED ORDER — LACTATED RINGERS IV SOLN: INTRAVENOUS | Status: DC

## 2022-01-30 MED ORDER — PROPOFOL 500 MG/50ML IV EMUL
INTRAVENOUS | Status: DC | PRN
  Administered 2022-01-30: 125 ug/kg/min via INTRAVENOUS

## 2022-01-30 MED ORDER — FENTANYL CITRATE (PF) 100 MCG/2ML IJ SOLN
INTRAMUSCULAR | Status: AC
  Filled 2022-01-30: qty 2

## 2022-01-30 MED ORDER — SODIUM CHLORIDE 0.9 % IV SOLN: INTRAVENOUS | Status: DC

## 2022-01-30 NOTE — Progress Notes (Addendum)
?Subjective: ? ?Patient states she feels much better since hospitalization.  No history of hematemesis melena or rectal bleeding hematuria or vaginal bleeding.  She complains of dysphagia.  She points to suprasternal area as site of bolus obstruction.  She says she has had her esophagus dilated 5 or 6 times but most recently 10 years ago.  She also complains of epigastric and right upper quadrant abdominal pain.  She did take BC powder for 1 or 2 days before coming to hospital for headache.  She does not take NSAIDs regularly. ? ?Current Medications: ? ?Current Facility-Administered Medications:  ?  0.45 % sodium chloride infusion, , Intravenous, Continuous, [name], MD, Last Rate: 100 mL/hr at 01/30/22 0818, New Bag at 01/30/22 0818 ?  0.9 %  sodium chloride infusion, , Intravenous, Continuous, Annitta Needs, NP ?  [MAR Hold] acetaminophen (TYLENOL) tablet 650 mg, 650 mg, Oral, Q6H PRN, 650 mg at 01/28/22 1951 **OR** [MAR Hold] acetaminophen (TYLENOL) suppository 650 mg, 650 mg, Rectal, Q6H PRN, Emokpae, [name], MD ?  [MAR Hold] butalbital-acetaminophen-caffeine (FIORICET) 50-325-40 MG per tablet 1 tablet, 1 tablet, Oral, Q6H PRN, [name], MD, 1 tablet at 01/30/22 1045 ?  [MAR Hold] diphenhydrAMINE (BENADRYL) injection 25 mg, 25 mg, Intravenous, Q6H PRN, [name], MD, 25 mg at 01/29/22 2220 ?  [MAR Hold] diphenhydrAMINE (BENADRYL) injection 25 mg, 25 mg, Intravenous, Q6H, Mansy, [name], MD, 25 mg at 01/30/22 1008 ?  [MAR Hold] famotidine (PEPCID) IVPB 20 mg premix, 20 mg, Intravenous, Q12H, Mansy, [name], MD, Last Rate: 100 mL/hr at 01/30/22 1011, 20 mg at 01/30/22 1011 ?  [MAR Hold] fentaNYL (SUBLIMAZE) injection 25 mcg, 25 mcg, Intravenous, Q4H PRN, Mansy, [name], MD, 25 mcg at 01/30/22 0703 ?  [MAR Hold] FLUoxetine (PROZAC) capsule 20 mg, 20 mg, Oral, Daily, Ntuen, [name], FNP, 20 mg at 01/30/22 1044 ?  [MAR Hold] hydrOXYzine (ATARAX) tablet 25 mg, 25 mg, Oral, TID PRN, Ntuen, [name],  FNP ?  lactated ringers infusion, , Intravenous, Continuous, Battula, [name], MD, Last Rate: 50 mL/hr at 01/30/22 1218, New Bag at 01/30/22 1218 ?  [MAR Hold] loratadine (CLARITIN) tablet 10 mg, 10 mg, Oral, Daily, [name], MD, 10 mg at 01/30/22 1045 ?  [MAR Hold] LORazepam (ATIVAN) injection 0.5 mg, 0.5 mg, Intravenous, Once PRN, [name], MD ?  Doug Sou Hold] LORazepam (ATIVAN) tablet 0.5 mg, 0.5 mg, Oral, Q6H PRN, Emokpae, [name], MD, 0.5 mg at 01/29/22 2213 ?  [MAR Hold] methylPREDNISolone sodium succinate (SOLU-MEDROL) 40 mg/mL injection 40 mg, 40 mg, Intravenous, Q12H, [name], MD, 40 mg at 01/30/22 0819 ?  midazolam (VERSED) 2 MG/2ML injection, , , ,  ?  [MAR Hold] montelukast (SINGULAIR) tablet 10 mg, 10 mg, Oral, QHS, [name], MD ?  Doug Sou Hold] oxyCODONE-acetaminophen (PERCOCET/ROXICET) 5-325 MG per tablet 1 tablet, 1 tablet, Oral, Q4H PRN, [name], MD, 1 tablet at 01/29/22 2011 ?  [MAR Hold] pantoprazole (PROTONIX) injection 40 mg, 40 mg, Intravenous, Q24H, Emokpae, [name], MD, 40 mg at 01/29/22 1832 ?  [MAR Hold] pneumococcal 20-valent conjugate vaccine (PREVNAR 20) injection 0.5 mL, 0.5 mL, Intramuscular, Tomorrow-1000, Emokpae, [name], MD ?  [MAR Hold] senna-docusate (Senokot-S) tablet 2 tablet, 2 tablet, Oral, BID, [name], MD, 2 tablet at 01/30/22 1045 ? ? ?Objective: ?Blood pressure 131/73, pulse 86, temperature 98.1 ?F (36.7 ?C), temperature source Oral, resp. rate 17, height _0  (1.626 m), weight 54.4 kg, SpO2 98 %. ?Patient is alert  and in no acute distress. ?Oropharyngeal mucosa is dry.  She is edentulous. ?Conjunctiva is pink. Sclera is nonicteric ?Oropharyngeal mucosa is normal. ?No neck masses or thyromegaly noted. ?Cardiac exam with regular rhythm normal S1 and S2.  Grade 2/6 systolic murmur noted at aortic area. ?Lungs are clear to auscultation. ?Abdomen is symmetrical and soft.  Mild tenderness noted in midepigastric region  right upper quadrant.  No organomegaly or masses. ?No LE edema or clubbing noted. 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 ? ?Labs/studies Results: ? ? ? ?  Latest Ref Rng & Units 01/30/2022  ?  5:33 AM 01/29/2022  ?  4:50 AM 01/28/2022  ?  8:30 AM  ?CBC  ?WBC 4.0 - 10.5 K/uL 20.1   13.1   11.3    ?Hemoglobin 12.0 - 15.0 g/dL 8.5   8.5   6.7    ?Hematocrit 36.0 - 46.0 % 30.9   30.7   27.7    ?Platelets 150 - 400 K/uL 1,392   1,342   1,557    ?  ? ?  Latest Ref Rng & Units 01/30/2022  ?  5:33 AM 01/29/2022  ?  4:50 AM 01/28/2022  ?  8:30 AM  ?CMP  ?Glucose 70 - 99 mg/dL 124   97   82    ?BUN 6 - 20 mg/dL _0 ?Creatinine 0.44 - 1.00 mg/dL 0.90   0.89   0.77    ?Sodium 135 - 145 mmol/L 141   140   139    ?Potassium 3.5 - 5.1 mmol/L 3.7   3.8   3.9    ?Chloride 98 - 111 mmol/L 107   104   107    ?CO2 22 - 32 mmol/L _1 ?Calcium 8.9 - 10.3 mg/dL 9.1   9.1   9.1    ?Total Protein 6.5 - 8.1 g/dL 6.5    8.1    ?Total Bilirubin 0.3 - 1.2 mg/dL 0.7    0.1    ?Alkaline Phos 38 - 126 U/L 45    55    ?AST 15 - 41 U/L 18    24    ?ALT 0 - 44 U/L 9    14    ?  ? ?  Latest Ref Rng & Units 01/30/2022  ?  5:33 AM 01/28/2022  ?  8:30 AM  ?Hepatic Function  ?Total Protein 6.5 - 8.1 g/dL 6.5   8.1    ?Albumin 3.5 - 5.0 g/dL 3.7   4.2    ?AST 15 - 41 U/L 18   24    ?ALT 0 - 44 U/L 9   14    ?Alk Phosphatase 38 - 126 U/L 45   55    ?Total Bilirubin 0.3 - 1.2 mg/dL 0.7   0.1    ?  ? ? ?Assessment: ? ?#1.  Iron deficiency anemia.  No history of overt GI bleed.  She is tender epigastrium.  She could have peptic ulcer disease.  Patient has received 2 units of PRBCs. ? ?#2.  Esophageal dysphagia.  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  She will undergo esophageal dilation unless there is contraindication. ? ?Multiple other problems as outlined in Dr. Lyman Speller note. ? ? ?Plan: ? ?Proceed with esophagogastroduodenoscopy with esophageal dilation and ?Colonoscopy ? ? ? ? ?

## 2022-01-30 NOTE — Anesthesia Postprocedure Evaluation (Signed)
Anesthesia Post Note ? ?Patient: Tanya Stout ? ?Procedure(s) Performed: BIOPSY ?FLEXIBLE SIGMOIDOSCOPY ?ESOPHAGOGASTRODUODENOSCOPY (EGD) WITH PROPOFOL ? ?Patient location during evaluation: Phase II ?Anesthesia Type: General ?Level of consciousness: awake and alert and oriented ?Pain management: pain level controlled ?Vital Signs Assessment: post-procedure vital signs reviewed and stable ?Respiratory status: spontaneous breathing, nonlabored ventilation and respiratory function stable ?Cardiovascular status: blood pressure returned to baseline and stable ?Postop Assessment: no apparent nausea or vomiting ?Anesthetic complications: no ? ? ?No notable events documented. ? ? ?Last Vitals:  ?Vitals:  ? 01/30/22 1345 01/30/22 1400  ?BP: 108/63 121/73  ?Pulse: 61   ?Resp: 12   ?Temp:  36.6 ?C  ?SpO2: 100%   ?  ?Last Pain:  ?Vitals:  ? 01/30/22 1400  ?TempSrc:   ?PainSc: 10-Worst pain ever  ? ? ?  ?  ?  ?  ?  ?  ? ?Leocadio Heal C Kai Railsback ? ? ? ? ?

## 2022-01-30 NOTE — Progress Notes (Signed)
? ?[facility] ?PROGRESS NOTE ? ? ?Tanya Stout BWI:[id number] DOB: 1966/01/05 DOA: 01/28/2022 ? ?PCP: Pcp, No ? ?Brief History/Interval Summary: 56 y.o. female with medical history significant for anemia, breast and cervical cancer.  But apparently has not seen any medical provider in the last 10 years.  Presented to the ED with multiple complaints including body aches, headaches, neck stiffness.  Apparently lost multiple family members in the last few weeks including her father.  Has been feeling quite anxious sad.  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  Supposed to be on iron infusions.  Reported history of EGDs in the past was esophageal dilatation.  Patient was noted to have severe anemia with evidence for iron deficiency.  She was hospitalized for further management.  Due to concern for meningitis she underwent LP although it appears that the sample has clotted off.  Patient without any fever and no significant leukocytosis. ? ?Consultants: GI ? ?Procedures:  ?LP was attempted in the ED but the sample has clotted off. ? ?Transthoracic echocardiogram ? 1. Left ventricular ejection fraction, by estimation, is 60 to 65%. The  ?left ventricle has normal function. The left ventricle has no regional  ?wall motion abnormalities. Left ventricular diastolic parameters were  ?normal.  ? 2. Right ventricular systolic function is normal. The right ventricular  ?size is normal.  ? 3. The mitral valve is normal in structure. No evidence of mitral valve  ?regurgitation. No evidence of mitral stenosis.  ? 4. The aortic valve is tricuspid. There is mild calcification of the  ?aortic valve. There is mild thickening of the aortic valve. Aortic valve  ?regurgitation is not visualized. Mild aortic valve stenosis. Aortic valve  ?mean gradient measures 10.0 mmHg.  ?Aortic valve peak gradient measures 18.8 mmHg. Aortic valve area, by VTI  ?measures 1.59 cm?.  ? 5. The  inferior vena cava is normal in size with greater than 50%  ?respiratory variability, suggesting right atrial pressure of 3 mmHg.  ? ? ? ?Subjective/Interval History: ?Patient noted to be in better spirits this morning.  Had another episode of allergic reaction overnight.  This was after she was given Toradol.   ?Feels stronger.  Able to move her lower extremities now.  Continues to have headache.  No nausea or vomiting.  No chest pain or shortness of breath. ? ? ? ?Assessment/Plan: ? ?Severe iron deficiency anemia ?Presented with a hemoglobin of 6.7. Patient was transfused 2 units of PRBC.  ?Hemoglobin has responded and is stable.  ?Anemia panel was done which revealed ferritin of 1, iron of 17, TIBC 630.  Folic acid 8.4 and vitamin B12 359. ?Was given ferric gluconate but may have developed an allergic reaction to same.  Further doses have been discontinued. ? ?History of Plummer-Vinson/dysphagia ?Reports having had endoscopies previously.  Did report some dysphagia. ?Placed on PPI.  Gastroenterology consulted.  Plan is for upper endoscopy and colonoscopy today.   ? ?Severe allergic reaction ?Patient developed hives with facial swelling but no stridor or shortness of breath.  The inciting agent was not clearly identified though it could have been ferric gluconate.  Had another episode overnight after she was given Toradol.  Both of these medications have been discontinued.  She has been started on Pepcid Claritin.  Singulair has been added.  Continue Solu-Medrol.  No airway compromise at this time. ? ?Urinary tract infection ?Abnormal UA was noted.  Patient however denies any symptoms.  She received  2 doses of ceftriaxone.  We will hold off on further doses for now.   ?Leukocytosis is due to steroids. ? ?Generalized body ache including headache neck pain and back pain ?Probably psychosomatic.  But she did have some weakness of the left lower extremity.  Patient is a poor historian.  She has good reflexes  bilaterally.  She does have a history of breast and cervical cancer.   ?Since patient was complaining of back pain headaches and there was some concern for lower extremity weakness MRI of the spine was pursued.  Patient underwent MRI of cervical thoracic and lumbar spine.  MRI brain was also performed.  No acute findings were noted in any of these imaging studies.  Possibly benign lesion was noted in T10.  Follow-up study in 1 month was recommended. ?She also underwent CT of the abdomen pelvis, CT of the head and CT of the neck soft tissue all of which did not show any acute or concerning findings.  Moderate stool volume was noted.  Place on laxatives. ?It appears that ED provider attempted an LP due to her complaints of headache and neck stiffness.  She was afebrile without any significant elevation in her WBC.  CSF appears to have clotted off.  No CSF analysis could be performed.  No clear indication to repeat LP at this time as the suspicion for meningitis is very low.  Continue to monitor. ? ?Cardiac murmur/mild aortic valve stenosis ?Echocardiogram shows normal systolic function.  Mild aortic valve stenosis noted.  Outpatient follow-up. ? ?Thrombocytosis ?Most likely reactive and secondary to iron deficiency.  Continue to trend for now. ?Give IV fluids for 24 hours.  Will likely need to be referred to hematology at discharge. ? ?Grief reaction ?Patient crying.  She apparently lost multiple family members recently.  Psychiatry was consulted.  Patient being continued on fluoxetine.  No other recommendation by psychiatry. ? ? ?DVT Prophylaxis: SCDs ?Code Status: Full code ?Family Communication: Discussed with patient ?Disposition Plan: Likely return home when improved.  Mobilize. ? ?Status is: Inpatient ?Remains inpatient appropriate because: Severe anemia, need for GI work-up ? ? ? ?Medications: Scheduled: ? diphenhydrAMINE  25 mg Intravenous Q6H  ? FLUoxetine  20 mg Oral Daily  ? loratadine  10 mg Oral Daily  ?  methylPREDNISolone (SOLU-MEDROL) injection  40 mg Intravenous Q12H  ? montelukast  10 mg Oral QHS  ? pantoprazole (PROTONIX) IV  40 mg Intravenous Q24H  ? pneumococcal 20-valent conjugate vaccine  0.5 mL Intramuscular Tomorrow-1000  ? senna-docusate  2 tablet Oral BID  ? ?Continuous: ? sodium chloride 100 mL/hr at 01/30/22 0818  ? famotidine (PEPCID) IV 20 mg (01/30/22 1011)  ? ?KKX:[id number] **OR** acetaminophen, butalbital-acetaminophen-caffeine, diphenhydrAMINE, fentaNYL (SUBLIMAZE) injection, hydrOXYzine, LORazepam, LORazepam, oxyCODONE-acetaminophen ? ?Antibiotics: ?Anti-infectives (From admission, onward)  ? ? Start     Dose/Rate Route Frequency Ordered Stop  ? 01/29/22 1000  cefTRIAXone (ROCEPHIN) 1 g in sodium chloride 0.9 % 100 mL IVPB       ? 1 g ?200 mL/hr over 30 Minutes Intravenous  Once 01/28/22 1819 01/29/22 0844  ? 01/28/22 1030  cefTRIAXone (ROCEPHIN) 2 g in sodium chloride 0.9 % 100 mL IVPB       ? 2 g ?200 mL/hr over 30 Minutes Intravenous  Once 01/28/22 1025 01/28/22 1202  ? 01/28/22 1030  vancomycin (VANCOCIN) IVPB 1000 mg/200 mL premix       ? 1,000 mg ?200 mL/hr over 60 Minutes Intravenous  Once 01/28/22 1025 01/28/22 1632  ? ?  ? ? ?  Objective: ? ?Vital Signs ? ?Vitals:  ? 01/29/22 2010 01/29/22 2231 01/30/22 0550 01/30/22 0703  ?BP: 123/83 132/79 115/67 125/72  ?Pulse: 71 78 74 69  ?Resp:  19 18   ?Temp:  98.9 ?F (37.2 ?C) 98.9 ?F (37.2 ?C)   ?TempSrc:  Oral    ?SpO2:  99% 95%   ?Weight:      ?Height:      ? ? ?Intake/Output Summary (Last 24 hours) at 01/30/2022 1038 ?Last data filed at 01/30/2022 0500 ?Gross per 24 hour  ?Intake 490 ml  ?Output 350 ml  ?Net 140 ml  ? ? ?Filed Weights  ? 01/28/22 0825  ?Weight: 54.4 kg  ? ? ?General appearance: Awake alert.  In no distress ?Resp: Clear to auscultation bilaterally.  Normal effort ?Cardio: S1-S2 is normal regular.  Systolic murmur over precordium  ?GI: Abdomen is soft.  Nontender nondistended.  Bowel sounds are present normal.  No masses  organomegaly ?Extremities: No edema.  Full range of motion of lower extremities. ?Neurologic: Alert and oriented x3.  No focal neurological deficits.  ? ? ?Lab Results: ? ?Data Reviewed: I have personally reviewed foll

## 2022-01-30 NOTE — Anesthesia Preprocedure Evaluation (Signed)
Anesthesia Evaluation  ?Patient identified by MRN, date of birth, ID band ?Patient awake ? ? ? ?Reviewed: ?Allergy & Precautions, NPO status , Patient's Chart, lab work & pertinent test results ? ?History of Anesthesia Complications ?Negative for: history of anesthetic complications ? ?Airway ?Mallampati: II ? ?TM Distance: >3 FB ?Neck ROM: Full ? ? ? Dental ? ?(+) Dental Advisory Given, Missing, Poor Dentition, Chipped ?  ?Pulmonary ?neg pulmonary ROS,  ?  ?Pulmonary exam normal ?breath sounds clear to auscultation ? ? ? ? ? ? Cardiovascular ?Normal cardiovascular exam+ Valvular Problems/Murmurs  ?Rhythm:Regular Rate:Normal ? ?1. Left ventricular ejection fraction, by estimation, is 60 to 65%. The left ventricle has normal function. The left ventricle has no regional wall motion abnormalities. Left ventricular diastolic parameters were normal.  ??2. Right ventricular systolic function is normal. The right ventricular size is normal.  ??3. The mitral valve is normal in structure. No evidence of mitral valve regurgitation. No evidence of mitral stenosis.  ??4. The aortic valve is tricuspid. There is mild calcification of the aortic valve. There is mild thickening of the aortic valve. Aortic valve regurgitation is not visualized. Mild aortic valve stenosis. Aortic valve mean gradient measures 10.0 mmHg.  ?Aortic valve peak gradient measures 18.8 mmHg. Aortic valve area, by VTI measures 1.59 cm?.  ??5. The inferior vena cava is normal in size with greater than 50% respiratory variability, suggesting right atrial pressure of 3 mmHg.  ?  ?Neuro/Psych ?PSYCHIATRIC DISORDERS Anxiety Depression negative neurological ROS ?   ? GI/Hepatic ?negative GI ROS, (+)  ?  ? substance abuse ? marijuana use,   ?Endo/Other  ?negative endocrine ROS ? Renal/GU ?negative Renal ROS  ?Female GU complaint (cervical cancer) ? ? ?  ?Musculoskeletal ?negative musculoskeletal ROS ?(+)  ? Abdominal ?   ?Peds ?negative pediatric ROS ?(+)  Hematology ? ?(+) Blood dyscrasia (thrombocytosis ), anemia ,   ?Anesthesia Other Findings ?Breast cancer ? Reproductive/Obstetrics ?negative OB ROS ? ?  ? ? ? ? ? ? ? ? ? ? ? ? ? ?  ?  ? ? ? ? ? ? ? ? ?Anesthesia Physical ?Anesthesia Plan ? ?ASA: 3 ? ?Anesthesia Plan: General  ? ?Post-op Pain Management: Minimal or no pain anticipated  ? ?Induction: Intravenous ? ?PONV Risk Score and Plan: Propofol infusion ? ?Airway Management Planned: Nasal Cannula and Natural Airway ? ?Additional Equipment:  ? ?Intra-op Plan:  ? ?Post-operative Plan:  ? ?Informed Consent: I have reviewed the patients History and Physical, chart, labs and discussed the procedure including the risks, benefits and alternatives for the proposed anesthesia with the patient or authorized representative who has indicated his/her understanding and acceptance.  ? ? ? ?Dental advisory given ? ?Plan Discussed with: CRNA and Surgeon ? ?Anesthesia Plan Comments:   ? ? ? ? ? ? ?Anesthesia Quick Evaluation ? ?

## 2022-01-30 NOTE — Progress Notes (Signed)
Date and time results received: 01/30/22 0650 ? ?Test: Platelets  ?Critical Value: 1,392 ? ?Name of Provider Notified: Mansy MD ? ?Orders Received? Or Actions Taken?: No orders received, told to pass onto day shift doctor.  ?

## 2022-01-30 NOTE — Op Note (Signed)
[facility] ?Patient Name: Tanya Stout ?Procedure Date: 01/30/2022 1:34 PM ?MRN: [id number] ?Date of Birth: [birth year] ?Attending MD: Hildred Laser , MD ?CSN: [id number] ?Age: 56 ?Admit Type: Outpatient ?Procedure:                Colonoscopy ?Indications:              Iron deficiency anemia ?Providers:                Hildred Laser, MD, [place] Page, [place] Cyndi Bender  ?                          Tech, Technician ?Referring MD:              ?Medicines:                Propofol per Anesthesia ?Complications:            No immediate complications. ?Estimated Blood Loss:     Estimated blood loss: none. ?Procedure:                Pre-Anesthesia Assessment: ?                          - Prior to the procedure, a History and Physical  ?                          was performed, and patient medications and  ?                          allergies were reviewed. The patient's tolerance of  ?                          previous anesthesia was also reviewed. The risks  ?                          and benefits of the procedure and the sedation  ?                          options and risks were discussed with the patient.  ?                          All questions were answered, and informed consent  ?                          was obtained. Prior Anticoagulants: The patient has  ?                          taken no previous anticoagulant or antiplatelet  ?                          agents. ASA Grade Assessment: II - A patient with  ?                          mild systemic disease. After reviewing the risks  ?  and benefits, the patient was deemed in  ?                          satisfactory condition to undergo the procedure. ?                          After obtaining informed consent, the colonoscope  ?                          was passed under direct vision. Throughout the  ?                          procedure, the patient's blood pressure, pulse, and  ?                          oxygen saturations were monitored  continuously. The  ?                          PCF-HQ190L ([id number]) scope was introduced through  ?                          the anus with the intention of advancing to the  ?                          cecum. The scope was advanced to the sigmoid colon  ?                          before the procedure was aborted. Medications were  ?                          given. The colonoscopy was performed without  ?                          difficulty. The patient tolerated the procedure  ?                          well. The quality of the bowel preparation was  ?                          poor. The rectum was photographed. ?Scope In: 1:37:18 PM ?Scope Out: 1:38:33 PM ?Total Procedure Duration: 0 hours 1 minute 15 seconds  ?Findings: ?     The perianal and digital rectal examinations were normal. ?     A large amount of stool was found in the rectum and in the recto-sigmoid  ?     colon, precluding visualization. ?     Therefore examination aborted. ?Impression:               - Preparation of the colon was poor. ?                          - Stool in the rectum and in the recto-sigmoid  ?                          colon resulting  in incomplete examination. ?                          - No specimens collected. ?Moderate Sedation: ?     Per Anesthesia Care ?Recommendation:           - Return patient to hospital ward for ongoing care. ?                          - Clear liquid diet today. ?                          - Continue present medications. ?                          - Repeat colonoscopy tomorrow. ?Procedure Code(s):        --- Professional --- ?                          [phone], 53, Colonoscopy, flexible; diagnostic,  ?                          including collection of specimen(s) by brushing or  ?                          washing, when performed (separate procedure) ?Diagnosis Code(s):        --- Professional --- ?                          D50.9, Iron deficiency anemia, unspecified ?CPT copyright 2019 American Medical Association.  All rights reserved. ?The codes documented in this report are preliminary and upon coder review may  ?be revised to meet current compliance requirements. ?Hildred Laser, MD ?Hildred Laser, MD ?01/30/2022 1:58:56 PM ?This report has been signed electronically. ?Number of Addenda: 0 ?

## 2022-01-30 NOTE — Transfer of Care (Signed)
Immediate Anesthesia Transfer of Care Note ? ?Patient: Tanya Stout ? ?Procedure(s) Performed: BIOPSY ?FLEXIBLE SIGMOIDOSCOPY ?ESOPHAGOGASTRODUODENOSCOPY (EGD) WITH PROPOFOL ? ?Patient Location: PACU ? ?Anesthesia Type:General ? ?Level of Consciousness: drowsy ? ?Airway & Oxygen Therapy: Patient Spontanous Breathing and Patient connected to nasal cannula oxygen ? ?Post-op Assessment: Report given to RN and Post -op Vital signs reviewed and stable ? ?Post vital signs: Reviewed and stable ? ?Last Vitals:  ?Vitals Value Taken Time  ?BP 105/60 01/30/22 1344  ?Temp    ?Pulse 61 01/30/22 1345  ?Resp 12 01/30/22 1345  ?SpO2 100 % 01/30/22 1345  ?Vitals shown include unvalidated device data. ? ?Last Pain:  ?Vitals:  ? 01/30/22 1313  ?TempSrc:   ?PainSc: 10-Worst pain ever  ?   ? ?  ? ?Complications: No notable events documented. ?

## 2022-01-30 NOTE — Progress Notes (Signed)
Patient is being reprepped with GoLytely for colonoscopy to be repeated on 01/31/2022. ?

## 2022-01-30 NOTE — Op Note (Signed)
[facility] ?Patient Name: Tanya Stout ?Procedure Date: 01/30/2022 12:11 PM ?MRN: [id number] ?Date of Birth: 07-23-1966 ?Attending MD: Hildred Laser , MD ?CSN: [id number] ?Age: 56 ?Admit Type: Outpatient ?Procedure:                Upper GI endoscopy ?Indications:              Iron deficiency anemia, Esophageal dysphagia ?Providers:                Hildred Laser, MD, [place] Page, [place] Cyndi Bender  ?                          Tech, Technician ?Referring MD:              ?Medicines:                Propofol per Anesthesia ?Complications:            No immediate complications. ?Estimated Blood Loss:     Estimated blood loss was minimal. ?Procedure:                Pre-Anesthesia Assessment: ?                          - Prior to the procedure, a History and Physical  ?                          was performed, and patient medications and  ?                          allergies were reviewed. The patient's tolerance of  ?                          previous anesthesia was also reviewed. The risks  ?                          and benefits of the procedure and the sedation  ?                          options and risks were discussed with the patient.  ?                          All questions were answered, and informed consent  ?                          was obtained. Prior Anticoagulants: The patient has  ?                          taken no previous anticoagulant or antiplatelet  ?                          agents. ASA Grade Assessment: II - A patient with  ?                          mild systemic disease. After reviewing the risks  ?  and benefits, the patient was deemed in  ?                          satisfactory condition to undergo the procedure. ?                          After obtaining informed consent, the endoscope was  ?                          passed under direct vision. Throughout the  ?                          procedure, the patient's blood pressure, pulse, and  ?                           oxygen saturations were monitored continuously. The  ?                          [id number]-[id number] ([id number]) scope was introduced through the  ?                          mouth, and advanced to the second part of duodenum.  ?                          The upper GI endoscopy was accomplished without  ?                          difficulty. The patient tolerated the procedure  ?                          well. ?Scope In: 1:18:02 PM ?Scope Out: 1:32:08 PM ?Total Procedure Duration: 0 hours 14 minutes 6 seconds  ?Findings: ?     The hypopharynx was normal. ?     Four benign-appearing, intrinsic severe stenoses with esophageal webs  ?     were found 22 to 28 cm from the incisors. These labs were disrupted with  ?     the scope. Still unable to pass the regular scope distally. The stenoses  ?     were traversed after downsizing scope. ?     The Z-line was regular and was found 37 cm from the incisors. ?     A 2 cm hiatal hernia was present. ?     The entire examined stomach was normal. ?     The duodenal bulb was normal. Biopsies were taken with a cold forceps  ?     for histology. ?Impression:               - Normal hypopharynx. ?                          - Benign-appearing esophageal stenoses with  ?                          multiple esophageal webs and proximal esophagus  ?  disrupted with the scope. ?                          - Z-line regular, 37 cm from the incisors. ?                          - 2 cm hiatal hernia. ?                          - Normal stomach. ?                          - Normal duodenal bulb. Biopsied. ?Moderate Sedation: ?     Per Anesthesia Care ?Recommendation:           - Return patient to hospital ward for ongoing care. ?                          - Clear liquid diet today. ?                          - See the other procedure note for documentation of  ?                          additional recommendations. ?                          - Continue present medications. ?                           - Await pathology results. ?                          - Repeat upper endoscopy at appointment to be  ?                          scheduled under fluoroscopy. ?Procedure Code(s):        --- Professional --- ?                          [phone], Esophagogastroduodenoscopy, flexible,  ?                          transoral; with biopsy, single or multiple ?Diagnosis Code(s):        --- Professional --- ?                          K22.2, Esophageal obstruction ?                          K44.9, Diaphragmatic hernia without obstruction or  ?                          gangrene ?                          D50.9, Iron deficiency anemia, unspecified ?  R13.14, Dysphagia, pharyngoesophageal phase ?CPT copyright 2019 American Medical Association. All rights reserved. ?The codes documented in this report are preliminary and upon coder review may  ?be revised to meet current compliance requirements. ?Hildred Laser, MD ?Hildred Laser, MD ?01/30/2022 1:54:58 PM ?This report has been signed electronically. ?Number of Addenda: 0 ?

## 2022-01-30 NOTE — Progress Notes (Signed)
Brief EGD and colonoscopy notes ? ?EGD ? ?Esophageal luminal narrowing with multiple rings in the proximal esophagus. ?Some of the rings disrupted with scope which could not be advanced distally. ?Therefore examination completed with ultraslim scope. ?2 cm sliding hiatal hernia. ?Normal exam of the stomach first and second part of duodenum. ?Duodenal biopsy taken to check for celiac disease. ? ? ?Colonoscopy. ?Formed stool noted on digital exam. ?Large amount of stool noted in rectosigmoid junction.  Therefore colonoscopy aborted. ?

## 2022-01-31 ENCOUNTER — Encounter (HOSPITAL_COMMUNITY)
Admission: EM | Disposition: A | Discharge status: Home / Self Care | Payer: Self-pay | Source: Home / Self Care | Attending: Internal Medicine

## 2022-01-31 ENCOUNTER — Inpatient Hospital Stay (HOSPITAL_COMMUNITY): Payer: Self-pay | Admitting: Anesthesiology

## 2022-01-31 ENCOUNTER — Encounter (HOSPITAL_COMMUNITY): Payer: Self-pay | Admitting: Internal Medicine

## 2022-01-31 DIAGNOSIS — C539 Malignant neoplasm of cervix uteri, unspecified: Secondary | ICD-10-CM

## 2022-01-31 DIAGNOSIS — D509 Iron deficiency anemia, unspecified: Secondary | ICD-10-CM

## 2022-01-31 DIAGNOSIS — F418 Other specified anxiety disorders: Secondary | ICD-10-CM

## 2022-01-31 DIAGNOSIS — D75839 Thrombocytosis, unspecified: Secondary | ICD-10-CM

## 2022-01-31 DIAGNOSIS — F32A Depression, unspecified: Secondary | ICD-10-CM

## 2022-01-31 HISTORY — PX: COLONOSCOPY WITH PROPOFOL: SHX5780

## 2022-01-31 LAB — CBC
HCT: 30.7 % — ABNORMAL LOW (ref 36.0–46.0)
Hemoglobin: 8 g/dL — ABNORMAL LOW (ref 12.0–15.0)
MCH: 16.9 pg — ABNORMAL LOW (ref 26.0–34.0)
MCHC: 26.1 g/dL — ABNORMAL LOW (ref 30.0–36.0)
MCV: 64.8 fL — ABNORMAL LOW (ref 80.0–100.0)
Platelets: 1196 10*3/uL (ref 150–400)
RBC: 4.74 MIL/uL (ref 3.87–5.11)
RDW: 32.4 % — ABNORMAL HIGH (ref 11.5–15.5)
WBC: 16.3 10*3/uL — ABNORMAL HIGH (ref 4.0–10.5)
nRBC: 5 % — ABNORMAL HIGH (ref 0.0–0.2)

## 2022-01-31 LAB — BASIC METABOLIC PANEL
Anion gap: 9 (ref 5–15)
BUN: 19 mg/dL (ref 6–20)
CO2: 23 mmol/L (ref 22–32)
Calcium: 8.7 mg/dL — ABNORMAL LOW (ref 8.9–10.3)
Chloride: 108 mmol/L (ref 98–111)
Creatinine, Ser: 0.8 mg/dL (ref 0.44–1.00)
GFR, Estimated: >60 mL/min (ref >60)
Glucose, Bld: 100 mg/dL — ABNORMAL HIGH (ref 70–99)
Potassium: 3.9 mmol/L (ref 3.5–5.1)
Sodium: 140 mmol/L (ref 135–145)

## 2022-01-31 LAB — URINE CULTURE: Culture: >=100000 — AB

## 2022-01-31 SURGERY — COLONOSCOPY WITH PROPOFOL
Anesthesia: General

## 2022-01-31 MED ORDER — MIDAZOLAM HCL 2 MG/2ML IJ SOLN
2.0000 mg | Freq: Once | INTRAMUSCULAR | Status: AC
Start: 2022-01-31 — End: 2022-01-31
  Administered 2022-01-31: 2 mg via INTRAVENOUS
  Filled 2022-01-31: qty 2

## 2022-01-31 MED ORDER — LACTATED RINGERS IV SOLN
INTRAVENOUS | Status: DC
  Administered 2022-01-31: 1000 mL via INTRAVENOUS

## 2022-01-31 MED ORDER — SODIUM CHLORIDE 0.9 % IV SOLN: INTRAVENOUS | Status: DC

## 2022-01-31 MED ORDER — PROPOFOL 500 MG/50ML IV EMUL
INTRAVENOUS | Status: DC | PRN
  Administered 2022-01-31: 150 ug/kg/min via INTRAVENOUS

## 2022-01-31 MED ORDER — PROPOFOL 10 MG/ML IV BOLUS
INTRAVENOUS | Status: DC | PRN
  Administered 2022-01-31: 100 mg via INTRAVENOUS

## 2022-01-31 NOTE — Progress Notes (Signed)
?PROGRESS NOTE ? ? ? ?Tanya Stout  POE:[id number] DOB: 02-04-1966 DOA: 01/28/2022 ?PCP: Pcp, No  ? ? ?Brief Narrative:  ?56 y.o. female with past medical history significant for anemia, breast and cervical cancer but not been to a medical provider in the last 10 years presented to hospital with generalized body ache, headache, neck stiffness. Patient apparently lost multiple family members in the last few weeks including her father and has been feeling quite anxious sad.  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  Supposed to be on iron infusions.  Reported history of EGDs in the past with esophageal dilatation.  Patient was noted to have severe anemia with evidence for iron deficiency.  Due to concern for meningitis, she underwent LP although it appears that the sample has clotted off.  Patient without any fever and no significant leukocytosis. ?  ?Assessment/Plan: ?  ?Principal Problem: ?  Iron deficiency anemia ?Active Problems: ?  Body aches ?  Depression ?  UTI (urinary tract infection) ?  Thrombocytosis ?  Dysphagia ?  Cardiac murmur ?  ?Severe iron deficiency anemia ?Patient presented with hemoglobin of 6.7.  Received 2 units of packed RBC.  Latest hemoglobin of 8.0 which is appropriate.  Anemia panel was done which revealed ferritin of 1, iron of 17, TIBC 630.  Folic acid 8.4 and vitamin B12 359.Was given ferric gluconate but may have developed an allergic reaction to same.   Status post EGD.  Patient did have poor colon preparation so plan for repeat colonoscopy today. ?  ?History of Plummer-Vinson/dysphagia ?Reports having had endoscopies previously.  Did report some dysphagia. ?Continue PPI.  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  Biopsy is obtained.  Patient was advised clear liquids after the procedure.   ? ?Severe allergic reaction ?Thought to be secondary to  ferric gluconate.  Had another episode overnight after she was given Toradol.  Both of these medications have been discontinued.  Continue Pepcid Claritin.  Continue Solu-Medrol and Singulair.  No airway compromise at this time.   ? ?Asymptomatic bacteriuria ?Urinalysis was abnormal.  Received 2 doses of IV Rocephin, patient does have leukocytosis.  Urine culture showing E. coli more than 100,000 colonies.   ?  ?Generalized body ache including headache neck pain and back pain ?Probably psychosomatic.  Poor historian.  Reflexes intact. MRI of the spine without acute findings.  Possibly benign lesion was noted in T10.  Follow-up study in 1 month was recommended.  CT scan of the abdomen pelvis head were unremarkable. ED provider attempted an LP due to her complaints of headache and neck stiffness.  She was afebrile without any significant elevation in her WBC.  CSF appears to have clotted off.  No CSF analysis could be performed.  No clear indication to repeat LP at this time as the suspicion for meningitis is very low.  Patient is fully oriented at this time. ?  ?Cardiac murmur/mild aortic valve stenosis ?2D echocardiogram shows normal systolic function.  Mild aortic valve stenosis noted.  Recommend outpatient follow-up. ?  ?Thrombocytosis ?Most likely reactive and secondary to iron deficiency.  Continue to trend for now.  Will need to follow-up as outpatient.  Might benefit from hematology follow-up as outpatient due to significant thrombocytosis ? ?Grief reaction ?Apparently lost multiple family members recently.  Psychiatry was consulted.  Continue fluoxetine.  No other recommendation by psychiatry.  Patient is  inquiring about Xanax at this time. ?   ? DVT prophylaxis: SCDs Start: 01/28/22 1818 ? ? ?Code Status:   ?  Code Status: Full Code ? ?Disposition: Home likely in 1 to 2 days ?Status is: Inpatient ?Remains inpatient appropriate because: For colonoscopy, closer monitoring ? ? Family Communication: Communicated  with the patient at bedside ? ?Consultants:  ?GI ? ?Procedures:  ?EGD and colonoscopy on 01/30/2022 ? ?Antimicrobials:  ?Rocephin IV ? ?Anti-infectives (From admission, onward)  ? ? Start     Dose/Rate Route Frequency Ordered Stop  ? 01/29/22 1000  cefTRIAXone (ROCEPHIN) 1 g in sodium chloride 0.9 % 100 mL IVPB       ? 1 g ?200 mL/hr over 30 Minutes Intravenous  Once 01/28/22 1819 01/29/22 0844  ? 01/28/22 1030  cefTRIAXone (ROCEPHIN) 2 g in sodium chloride 0.9 % 100 mL IVPB       ? 2 g ?200 mL/hr over 30 Minutes Intravenous  Once 01/28/22 1025 01/28/22 1202  ? 01/28/22 1030  vancomycin (VANCOCIN) IVPB 1000 mg/200 mL premix       ? 1,000 mg ?200 mL/hr over 60 Minutes Intravenous  Once 01/28/22 1025 01/28/22 1632  ? ?  ? ? ? ?Subjective: ?Today, patient was seen and examined at bedside.  Patient feels anxious.  Denies any nausea vomiting abdominal pain.   ? ?Objective: ?Vitals:  ? 01/30/22 1400 01/30/22 2148 01/31/22 0523 01/31/22 0551  ?BP: 121/73 (!) 143/67 (!) 121/100 135/77  ?Pulse:  (!) 59 62 (!) 49  ?Resp:  18 17   ?Temp: 97.9 ?F (36.6 ?C) 98.1 ?F (36.7 ?C) 98.1 ?F (36.7 ?C)   ?TempSrc:  Oral Oral   ?SpO2:  100% 99%   ?Weight:      ?Height:      ? ? ?Intake/Output Summary (Last 24 hours) at 01/31/2022 1001 ?Last data filed at 01/30/2022 1817 ?Gross per 24 hour  ?Intake 1306.5 ml  ?Output 0 ml  ?Net 1306.5 ml  ? ?Filed Weights  ? 01/28/22 0825  ?Weight: 54.4 kg  ? ?Body mass index is 20.6 kg/m?Marland Kitchen  ?Physical Examination: ? ?General: Thinly built, not in obvious distress, anxious ?HENT:   No scleral pallor or icterus noted. Oral mucosa is moist.  ?Chest:  Clear breath sounds.  Diminished breath sounds bilaterally. No crackles or wheezes.  ?CVS: S1 &S2 heard. No murmur.  Regular rate and rhythm. ?Abdomen: Soft, nontender, nondistended.  Bowel sounds are heard.   ?Extremities: No cyanosis, clubbing or edema.  Peripheral pulses are palpable. ?Psych: Alert, awake and oriented, normal mood ?CNS:  No cranial nerve deficits.   Power equal in all extremities.   ?Skin: Warm and dry.  No rashes noted. ? ?Data Reviewed:  ? ?CBC: ?Recent Labs  ?Lab 01/28/22 ?0830 01/29/22 ?0450 01/30/22 ?1027 01/31/22 ?2536  ?WBC 11.3* 13.1* 20.1* 16.3*  ?NEUTROABS 7.3  --   --   --   ?HGB 6.7* 8.5* 8.5* 8.0*  ?HCT 27.7* 30.7* 30.9* 30.7*  ?MCV 58.4* 63.0* 63.2* 64.8*  ?PLT 1,557* 1,342* 1,392* 1,196*  ? ? ?Basic Metabolic Panel: ?Recent Labs  ?Lab 01/28/22 ?0830 01/28/22 ?2232 01/29/22 ?0450 01/30/22 ?6440 01/31/22 ?3474  ?NA 139  --  140 141 140  ?K 3.9  --  3.8 3.7 3.9  ?CL 107  --  104 107 108  ?CO2 25  --  '26 27 23  '$ ?GLUCOSE 82  --  97 124* 100*  ?BUN 14  --  '13 18 19  '$ ?CREATININE 0.77  --  0.89 0.90 0.80  ?CALCIUM 9.1  --  9.1 9.1 8.7*  ?MG  --  1.9  --   --   --   ? ? ?Liver Function Tests: ?Recent Labs  ?Lab 01/28/22 ?0830 01/30/22 ?0533  ?AST 24 18  ?ALT 14 9  ?ALKPHOS 55 45  ?BILITOT 0.1* 0.7  ?PROT 8.1 6.5  ?ALBUMIN 4.2 3.7  ? ? ? ?Radiology Studies: ?MR BRAIN WO CONTRAST ? ?Result Date: 01/29/2022 ?CLINICAL DATA:  Headache EXAM: MRI HEAD WITHOUT CONTRAST TECHNIQUE: Multiplanar, multiecho pulse sequences of the brain and surrounding structures were obtained without intravenous contrast. COMPARISON:  Prior MRI, correlation is made with CT head 01/28/2022 FINDINGS: Brain: No restricted diffusion to suggest acute or subacute infarct. No acute hemorrhage, mass, mass effect, or midline shift. No hemosiderin deposition to suggest remote hemorrhage. Scattered T2 hyperintense signal in the periventricular white matter, which are nonspecific but likely the sequela of mild chronic small vessel ischemic disease. No juxtacortical or infratentorial T2 hyperintense foci. Vascular: Normal flow voids. Skull and upper cervical spine: Normal marrow signal. Sinuses/Orbits: Negative. Other: Trace fluid in the mastoid air cells. IMPRESSION: 1. No acute intracranial process. No etiology seen for the patient's headaches. 2. Scattered T2 hyperintense foci in the  periventricular white matter, which are nonspecific but likely the sequela of mild chronic small vessel ischemic disease. These are not in a pattern suggestive of demyelination. Electronically Signed   By: Loni Muse

## 2022-01-31 NOTE — Progress Notes (Signed)
Colonoscopy note ? ?Examination performed to cecum. ?Preparation adequate ?No mass polyps or angiodysplasia identified. ?Patient tolerated the procedure well. ?

## 2022-01-31 NOTE — Transfer of Care (Signed)
Immediate Anesthesia Transfer of Care Note ? ?Patient: Tanya Stout ? ?Procedure(s) Performed: COLONOSCOPY WITH PROPOFOL ? ?Patient Location: PACU ? ?Anesthesia Type:General ? ?Level of Consciousness: awake, alert , oriented and patient cooperative ? ?Airway & Oxygen Therapy: Patient Spontanous Breathing ? ?Post-op Assessment: Report given to RN, Post -op Vital signs reviewed and stable and Patient moving all extremities X 4 ? ?Post vital signs: Reviewed and stable ? ?Last Vitals:  ?Vitals Value Taken Time  ?BP    ?Temp    ?Pulse 46 01/31/22 1637  ?Resp 10 01/31/22 1637  ?SpO2 98 % 01/31/22 1637  ?Vitals shown include unvalidated device data. ? ?Last Pain:  ?Vitals:  ? 01/31/22 1603  ?TempSrc:   ?PainSc: 10-Worst pain ever  ?   ? ?Patients Stated Pain Goal: 10 (01/31/22 1352) ? ?Complications: No notable events documented. ?

## 2022-01-31 NOTE — Op Note (Signed)
[facility] ?Patient Name: Tanya Stout ?Procedure Date: 01/31/2022 3:16 PM ?MRN: [id number] ?Date of Birth: [birth year] ?Attending MD: Hildred Laser , MD ?CSN: [id number] ?Age: 56 ?Admit Type: Inpatient ?Procedure:                Colonoscopy ?Indications:              Iron deficiency anemia ?Providers:                Hildred Laser, MD, Crystal Page, Gwynneth Albright RN, RN ?Referring MD:             Leslee Home, MD ?Medicines:                Propofol per Anesthesia ?Complications:            No immediate complications. ?Estimated Blood Loss:     Estimated blood loss: none. ?Procedure:                Pre-Anesthesia Assessment: ?                          - Prior to the procedure, a History and Physical  ?                          was performed, and patient medications and  ?                          allergies were reviewed. The patient's tolerance of  ?                          previous anesthesia was also reviewed. The risks  ?                          and benefits of the procedure and the sedation  ?                          options and risks were discussed with the patient.  ?                          All questions were answered, and informed consent  ?                          was obtained. Prior Anticoagulants: The patient has  ?                          taken no previous anticoagulant or antiplatelet  ?                          agents. ASA Grade Assessment: III - A patient with  ?                          severe systemic disease. After reviewing the risks  ?  and benefits, the patient was deemed in  ?                          satisfactory condition to undergo the procedure. ?                          After obtaining informed consent, the colonoscope  ?                          was passed under direct vision. Throughout the  ?                          procedure, the patient's blood pressure, pulse, and  ?                          oxygen saturations  were monitored continuously. The  ?                          [id number]-HQ190L ([id number]) scope was introduced through  ?                          the anus and advanced to the the cecum, identified  ?                          by appendiceal orifice and ileocecal valve. The  ?                          colonoscopy was somewhat difficult due to a  ?                          redundant colon. The patient tolerated the  ?                          procedure well. The quality of the bowel  ?                          preparation was adequate. ?Scope In: 4:08:43 PM ?Scope Out: 4:30:02 PM ?Scope Withdrawal Time: 0 hours 7 minutes 7 seconds  ?Total Procedure Duration: 0 hours 21 minutes 19 seconds  ?Findings: ?     The perianal and digital rectal examinations were normal. ?     The colon (entire examined portion) appeared normal. ?     The retroflexed view of the distal rectum and anal verge was normal and  ?     showed no anal or rectal abnormalities. ?Impression:               - The entire examined colon is normal. ?                          - No specimens collected. ?Moderate Sedation: ?     Per Anesthesia Care ?Recommendation:           - Patient has a contact number available for  ?  emergencies. The signs and symptoms of potential  ?                          delayed complications were discussed with the  ?                          patient. Return to normal activities tomorrow.  ?                          Written discharge instructions were provided to the  ?                          patient. ?                          - Mechanical soft diet today. ?                          - Continue present medications. ?                          - Repeat colonoscopy in 10 years for screening  ?                          purposes. ?Procedure Code(s):        --- Professional --- ?                          [phone], Colonoscopy, flexible; diagnostic, including  ?                          collection of specimen(s) by brushing  or washing,  ?                          when performed (separate procedure) ?Diagnosis Code(s):        --- Professional --- ?                          D50.9, Iron deficiency anemia, unspecified ?CPT copyright 2019 [employer]. All rights reserved. ?The codes documented in this report are preliminary and upon coder review may  ?be revised to meet current compliance requirements. ?Hildred Laser, MD ?Hildred Laser, MD ?01/31/2022 4:39:01 PM ?This report has been signed electronically. ?Number of Addenda: 0 ?

## 2022-01-31 NOTE — Hospital Course (Addendum)
56 y.o. female with past medical history significant for anemia, breast and cervical cancer but not been to a medical provider in the last 10 years presented to hospital with generalized body ache, headache, neck stiffness. Patient apparently lost multiple family members in the last few weeks including her father and has been feeling quite anxious sad.  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  Supposed to be on iron infusions.  Reported history of EGDs in the past with esophageal dilatation.  Patient was noted to have severe anemia with evidence for iron deficiency.  Due to concern for meningitis, she underwent LP although it appears that the sample has clotted off.  Patient without any fever and no significant leukocytosis.  Patient was then admitted hospital for further evaluation and treatment.   Assessment/Plan:   Principal Problem:   Iron deficiency anemia Active Problems:   Body aches   Depression   UTI (urinary tract infection)   Thrombocytosis   Dysphagia   Cardiac murmur   Severe iron deficiency anemia Patient presented with hemoglobin of 6.7.  Received 2 units of packed RBC.  Latest hemoglobin of 8.3 which is appropriate.  Anemia panel was done which revealed ferritin of 1, iron of 17, TIBC 630.  Folic acid 8.4 and vitamin B12 359.Was given ferric gluconate but may have developed an allergic reaction to same so this was continued.  Patient was given oral iron sulfate which she tolerated which will be continued on discharge.   Status post EGD with findings of esophageal web..  Colonoscopy did not show any findings suggestive for blood loss.  Is advised to continue iron on discharge.  History of Plummer-Vinson/dysphagia Reports having had endoscopies previously.  Did report some dysphagia. Continue PPI.  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   Biopsy was obtained.  Patient was advised clear liquids after the procedure.  Patient was advised to soft food on discharge.  Severe allergic reaction Thought to be secondary to ferric gluconate.  Oral iron will be continued on discharge.  Asymptomatic bacteriuria Urinalysis was abnormal.  Received 2 doses of IV Rocephin, patient does have leukocytosis.  Urine culture showing E. coli more than 100,000 colonies.     Generalized body ache including headache neck pain and back pain Probably psychosomatic.  Poor historian.  Reflexes intact. MRI of the spine without acute findings.  Possibly benign lesion was noted in T10.  Follow-up study in 1 month was recommended.  CT scan of the abdomen pelvis head were unremarkable. ED provider attempted an LP due to her complaints of headache and neck stiffness.  She was afebrile without any significant elevation in her WBC.  CSF appears to have clotted off.  No CSF analysis could be performed.  No clear indication to repeat LP at this time as the suspicion for meningitis is very low.  Patient is fully oriented at this time but has a chronic migraine headache..   Cardiac murmur/mild aortic valve stenosis 2D echocardiogram shows normal systolic function.  Mild aortic valve stenosis noted.  Recommend outpatient follow-up.   Thrombocytosis Most likely reactive and secondary to iron deficiency.  Continue to trend for now.  Will need to follow-up as outpatient.  Might benefit from hematology follow-up as outpatient due to significant thrombocytosis  Grief reaction Apparently lost multiple family members recently.  Psychiatry was consulted.  Continue fluoxetine on discharge.  No other  recommendation by psychiatry.

## 2022-01-31 NOTE — Anesthesia Postprocedure Evaluation (Signed)
Anesthesia Post Note ? ?Patient: Tanya Stout ? ?Procedure(s) Performed: COLONOSCOPY WITH PROPOFOL ? ?Patient location during evaluation: PACU ?Anesthesia Type: General ?Level of consciousness: awake and alert and oriented ?Pain management: pain level controlled ?Vital Signs Assessment: post-procedure vital signs reviewed and stable ?Respiratory status: spontaneous breathing, nonlabored ventilation and respiratory function stable ?Cardiovascular status: blood pressure returned to baseline and stable ?Postop Assessment: no apparent nausea or vomiting ?Anesthetic complications: no ? ? ?No notable events documented. ? ? ?Last Vitals:  ?Vitals:  ? 01/31/22 1638 01/31/22 1645  ?BP: 121/74 133/90  ?Pulse: (!) 46 (!) 41  ?Resp: 11 16  ?Temp: 36.9 ?C   ?SpO2: 98% 98%  ?  ?Last Pain:  ?Vitals:  ? 01/31/22 1645  ?TempSrc:   ?PainSc: 0-No pain  ? ? ?  ?  ?  ?  ?  ?  ? ?Talayia Hjort C Coleton Woon ? ? ? ? ?

## 2022-01-31 NOTE — Anesthesia Preprocedure Evaluation (Signed)
Anesthesia Evaluation  ?Patient identified by MRN, date of birth, ID band ?Patient awake ? ? ? ?Reviewed: ?Allergy & Precautions, NPO status , Patient's Chart, lab work & pertinent test results ? ?History of Anesthesia Complications ?Negative for: history of anesthetic complications ? ?Airway ?Mallampati: II ? ?TM Distance: >3 FB ?Neck ROM: Full ? ? ? Dental ? ?(+) Dental Advisory Given, Missing, Poor Dentition, Chipped ?  ?Pulmonary ?neg pulmonary ROS,  ?  ?Pulmonary exam normal ?breath sounds clear to auscultation ? ? ? ? ? ? Cardiovascular ?Exercise Tolerance: Good ?Normal cardiovascular exam+ Valvular Problems/Murmurs  ?Rhythm:Regular Rate:Normal ? ?1. Left ventricular ejection fraction, by estimation, is 60 to 65%. The left ventricle has normal function. The left ventricle has no regional wall motion abnormalities. Left ventricular diastolic parameters were normal.  ??2. Right ventricular systolic function is normal. The right ventricular size is normal.  ??3. The mitral valve is normal in structure. No evidence of mitral valve regurgitation. No evidence of mitral stenosis.  ??4. The aortic valve is tricuspid. There is mild calcification of the aortic valve. There is mild thickening of the aortic valve. Aortic valve regurgitation is not visualized. Mild aortic valve stenosis. Aortic valve mean gradient measures 10.0 mmHg.  ?Aortic valve peak gradient measures 18.8 mmHg. Aortic valve area, by VTI measures 1.59 cm?.  ??5. The inferior vena cava is normal in size with greater than 50% respiratory variability, suggesting right atrial pressure of 3 mmHg.  ?  ?Neuro/Psych ?PSYCHIATRIC DISORDERS Anxiety Depression negative neurological ROS ?   ? GI/Hepatic ?negative GI ROS, (+)  ?  ? substance abuse ? marijuana use,   ?Endo/Other  ?negative endocrine ROS ? Renal/GU ?negative Renal ROS  ?Female GU complaint (cervical cancer) ? ? ?  ?Musculoskeletal ?negative musculoskeletal ROS ?(+)  ?  Abdominal ?  ?Peds ?negative pediatric ROS ?(+)  Hematology ? 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 Reproductive/Obstetrics ?negative OB ROS ? ?  ? ? ? ? ? ? ? ? ? ? ? ? ? ?  ?  ? ? ? ? ? ? ? ? ?Anesthesia Physical ? ?Anesthesia Plan ? ?ASA: 3 ? ?Anesthesia Plan: General  ? ?Post-op Pain Management: Minimal or no pain anticipated  ? ?Induction: Intravenous ? ?PONV Risk Score and Plan: Propofol infusion ? ?Airway Management Planned: Nasal Cannula and Natural Airway ? ?Additional Equipment:  ? ?Intra-op Plan:  ? ?Post-operative Plan:  ? ?Informed Consent: I have reviewed the patients History and Physical, chart, labs and discussed the procedure including the risks, benefits and alternatives for the proposed anesthesia with the patient or authorized representative who has indicated his/her understanding and acceptance.  ? ? ? ?Dental advisory given ? ?Plan Discussed with: CRNA and Surgeon ? ?Anesthesia Plan Comments:   ? ? ? ? ? ? ?Anesthesia Quick Evaluation ? ?

## 2022-01-31 NOTE — Progress Notes (Signed)
?Subjective: ? ?Patient says she is starving.  She was able to drink more than half of GoLytely yesterday and was passing watery stools.  She complains of abdominal soreness. ? ?Current Medications: ? ?Current Facility-Administered Medications:  ?  0.9 %  sodium chloride infusion, , Intravenous, Continuous, Tanya Delman U, MD ?  [MAR Hold] acetaminophen (TYLENOL) tablet 650 mg, 650 mg, Oral, Q6H PRN, 650 mg at 01/28/22 1951 **OR** [MAR Hold] acetaminophen (TYLENOL) suppository 650 mg, 650 mg, Rectal, Q6H PRN, Emokpae, [name], MD ?  [MAR Hold] butalbital-acetaminophen-caffeine (FIORICET) 50-325-40 MG per tablet 1 tablet, 1 tablet, Oral, Q6H PRN, [name], MD, 1 tablet at 01/31/22 0947 ?  [MAR Hold] diphenhydrAMINE (BENADRYL) injection 25 mg, 25 mg, Intravenous, Q6H PRN, [name], MD, 25 mg at 01/29/22 2220 ?  [MAR Hold] diphenhydrAMINE (BENADRYL) injection 25 mg, 25 mg, Intravenous, Q6H, Mansy, [name], MD, 25 mg at 01/31/22 1200 ?  [MAR Hold] famotidine (PEPCID) IVPB 20 mg premix, 20 mg, Intravenous, Q12H, Mansy, [name], MD, Last Rate: 100 mL/hr at 01/31/22 0954, 20 mg at 01/31/22 0954 ?  [MAR Hold] fentaNYL (SUBLIMAZE) injection 25 mcg, 25 mcg, Intravenous, Q4H PRN, Mansy, [name], MD, 25 mcg at 01/30/22 1814 ?  [MAR Hold] FLUoxetine (PROZAC) capsule 20 mg, 20 mg, Oral, Daily, Ntuen, [name], FNP, 20 mg at 01/31/22 0948 ?  [MAR Hold] hydrOXYzine (ATARAX) tablet 25 mg, 25 mg, Oral, TID PRN, Ntuen, [name], FNP, 25 mg at 01/30/22 2151 ?  lactated ringers infusion, , Intravenous, Continuous, Battula, [name], MD, Last Rate: 50 mL/hr at 01/31/22 1526, 1,000 mL at 01/31/22 1526 ?  [MAR Hold] loratadine (CLARITIN) tablet 10 mg, 10 mg, Oral, Daily, [name], MD, 10 mg at 01/31/22 0948 ?  [MAR Hold] LORazepam (ATIVAN) injection 0.5 mg, 0.5 mg, Intravenous, Once PRN, [name], MD ?  Doug Sou Hold] LORazepam (ATIVAN) tablet 0.5 mg, 0.5 mg, Oral, Q6H PRN, Emokpae, [name], MD, 0.5 mg at  01/31/22 0947 ?  [MAR Hold] methylPREDNISolone sodium succinate (SOLU-MEDROL) 40 mg/mL injection 40 mg, 40 mg, Intravenous, Q12H, [name], MD, 40 mg at 01/31/22 0950 ?  [MAR Hold] montelukast (SINGULAIR) tablet 10 mg, 10 mg, Oral, QHS, [name], MD, 10 mg at 01/30/22 2129 ?  [MAR Hold] oxyCODONE-acetaminophen (PERCOCET/ROXICET) 5-325 MG per tablet 1 tablet, 1 tablet, Oral, Q4H PRN, [name], MD, 1 tablet at 01/31/22 0045 ?  [MAR Hold] pantoprazole (PROTONIX) injection 40 mg, 40 mg, Intravenous, Q24H, Emokpae, [name], MD, 40 mg at 01/30/22 1804 ?  [MAR Hold] pneumococcal 20-valent conjugate vaccine (PREVNAR 20) injection 0.5 mL, 0.5 mL, Intramuscular, Tomorrow-1000, Emokpae, [name], MD ?  [ERX Hold] senna-docusate (Senokot-S) tablet 2 tablet, 2 tablet, Oral, BID, [name], MD, 2 tablet at 01/30/22 2128 ? ? ?Objective: ?Blood pressure 139/85, pulse (!) 53, temperature 98.6 ?F (37 ?C), temperature source Oral, resp. rate (!) 22, height 5' 4"  (1.626 m), weight 54.4 kg, SpO2 98 %. ?Patient is alert and in no acute distress. ?She has angular stomatitis. ?She is edentulous. ?Cardiac exam regular rhythm normal S1-S2.  No murmur or gallop noted. ?Auscultation of lungs reveal vesicular breath sounds bilaterally. ?Abdomen is symmetrical soft with mild generalized tenderness.  No guarding.  No organomegaly or masses. ?No peripheral edema or clubbing noted. ?Labs/studies Results: ? ? ? ?  Latest Ref Rng & Units 01/31/2022  ?  5:50 AM 01/30/2022  ?  5:33 AM 01/29/2022  ?  4:50 AM  ?CBC  ?WBC  4.0 - 10.5 K/uL 16.3   20.1   13.1    ?Hemoglobin 12.0 - 15.0 g/dL 8.0   8.5   8.5    ?Hematocrit 36.0 - 46.0 % 30.7   30.9   30.7    ?Platelets 150 - 400 K/uL 1,196   1,392   1,342    ?  ? ?  Latest Ref Rng & Units 01/31/2022  ?  5:50 AM 01/30/2022  ?  5:33 AM 01/29/2022  ?  4:50 AM  ?CMP  ?Glucose 70 - 99 mg/dL 100   124   97    ?BUN 6 - 20 mg/dL 19   18   13     ?Creatinine 0.44 - 1.00 mg/dL 0.80   0.90    0.89    ?Sodium 135 - 145 mmol/L 140   141   140    ?Potassium 3.5 - 5.1 mmol/L 3.9   3.7   3.8    ?Chloride 98 - 111 mmol/L 108   107   104    ?CO2 22 - 32 mmol/L 23   27   26     ?Calcium 8.9 - 10.3 mg/dL 8.7   9.1   9.1    ?Total Protein 6.5 - 8.1 g/dL  6.5     ?Total Bilirubin 0.3 - 1.2 mg/dL  0.7     ?Alkaline Phos 38 - 126 Stout/L  45     ?AST 15 - 41 Stout/L  18     ?ALT 0 - 44 Stout/L  9     ?  ? ?  Latest Ref Rng & Units 01/30/2022  ?  5:33 AM 01/28/2022  ?  8:30 AM  ?Hepatic Function  ?Total Protein 6.5 - 8.1 g/dL 6.5   8.1    ?Albumin 3.5 - 5.0 g/dL 3.7   4.2    ?AST 15 - 41 Stout/L 18   24    ?ALT 0 - 44 Stout/L 9   14    ?Alk Phosphatase 38 - 126 Stout/L 45   55    ?Total Bilirubin 0.3 - 1.2 mg/dL 0.7   0.1    ?  ? ? ?Assessment: ? ?#1.  Iron deficiency anemia.  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  No bleeding lesion identified.  Colonoscopy could not be completed because of poor prep. ?Patient has received 3 units of PRBCs. ? ?#2.  Thrombocytosis.  Remains to be seen if it is due to neoplasm or due to GI blood loss. ? ? ? ? ?Plan: ?Proceed with diagnostic colonoscopy. ? ? ? ? ?

## 2022-02-01 ENCOUNTER — Other Ambulatory Visit: Payer: Self-pay

## 2022-02-01 ENCOUNTER — Telehealth: Payer: Self-pay | Admitting: Gastroenterology

## 2022-02-01 DIAGNOSIS — R519 Headache, unspecified: Secondary | ICD-10-CM | POA: Insufficient documentation

## 2022-02-01 DIAGNOSIS — D649 Anemia, unspecified: Secondary | ICD-10-CM

## 2022-02-01 LAB — CBC
HCT: 31.4 % — ABNORMAL LOW (ref 36.0–46.0)
Hemoglobin: 8.3 g/dL — ABNORMAL LOW (ref 12.0–15.0)
MCH: 17.5 pg — ABNORMAL LOW (ref 26.0–34.0)
MCHC: 26.4 g/dL — ABNORMAL LOW (ref 30.0–36.0)
MCV: 66.2 fL — ABNORMAL LOW (ref 80.0–100.0)
Platelets: 1079 10*3/uL (ref 150–400)
RBC: 4.74 MIL/uL (ref 3.87–5.11)
RDW: 34.3 % — ABNORMAL HIGH (ref 11.5–15.5)
WBC: 12.6 10*3/uL — ABNORMAL HIGH (ref 4.0–10.5)
nRBC: 4.4 % — ABNORMAL HIGH (ref 0.0–0.2)

## 2022-02-01 LAB — COMPREHENSIVE METABOLIC PANEL
ALT: 17 U/L (ref 0–44)
AST: 24 U/L (ref 15–41)
Albumin: 3.3 g/dL — ABNORMAL LOW (ref 3.5–5.0)
Alkaline Phosphatase: 49 U/L (ref 38–126)
Anion gap: 9 (ref 5–15)
BUN: 18 mg/dL (ref 6–20)
CO2: 23 mmol/L (ref 22–32)
Calcium: 8.6 mg/dL — ABNORMAL LOW (ref 8.9–10.3)
Chloride: 109 mmol/L (ref 98–111)
Creatinine, Ser: 0.88 mg/dL (ref 0.44–1.00)
GFR, Estimated: >60 mL/min (ref >60)
Glucose, Bld: 92 mg/dL (ref 70–99)
Potassium: 3.9 mmol/L (ref 3.5–5.1)
Sodium: 141 mmol/L (ref 135–145)
Total Bilirubin: 0.3 mg/dL (ref 0.3–1.2)
Total Protein: 6.2 g/dL — ABNORMAL LOW (ref 6.5–8.1)

## 2022-02-01 LAB — MAGNESIUM: Magnesium: 2.2 mg/dL (ref 1.7–2.4)

## 2022-02-01 LAB — SURGICAL PATHOLOGY

## 2022-02-01 MED ORDER — FERROUS SULFATE 325 (65 FE) MG PO TABS
325.0000 mg | ORAL_TABLET | Freq: Every day | ORAL | 3 refills | Status: AC
Start: 2022-02-01 — End: 2023-02-01

## 2022-02-01 MED ORDER — BUTALBITAL-APAP-CAFFEINE 50-325-40 MG PO TABS: 1.0000 | ORAL_TABLET | Freq: Four times a day (QID) | ORAL | 0 refills | Status: DC | PRN

## 2022-02-01 MED ORDER — FLUOXETINE HCL 20 MG PO CAPS: 20.0000 mg | ORAL_CAPSULE | Freq: Every day | ORAL | 3 refills | Status: AC

## 2022-02-01 MED ORDER — FERROUS SULFATE 325 (65 FE) MG PO TABS
325.0000 mg | ORAL_TABLET | Freq: Every day | ORAL | Status: DC
  Administered 2022-02-01: 325 mg via ORAL
  Filled 2022-02-01: qty 1

## 2022-02-01 MED ORDER — HYDROXYZINE HCL 25 MG PO TABS
25.0000 mg | ORAL_TABLET | Freq: Three times a day (TID) | ORAL | 0 refills | Status: AC | PRN
Start: 2022-02-01 — End: ?

## 2022-02-01 NOTE — Telephone Encounter (Signed)
Phoned and spoke with the pt and advised of CBC in one week @ [facility]. Pt expressed understanding and wanted directions put in the mail to her (which is done).

## 2022-02-01 NOTE — Discharge Summary (Signed)
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 CATCH Performed at [facility], [address], [place], [place] [address]    Special Requests   Final    NONE Performed at [facility], [address] [place], [place] [address]    Culture >=100,000 COLONIES/mL ESCHERICHIA COLI (A)  Final   Report Status 01/31/2022 FINAL  Final   Organism ID, Bacteria ESCHERICHIA COLI (A)  Final      Susceptibility   Escherichia coli - MIC*    AMPICILLIN >=32 RESISTANT Resistant     CEFAZOLIN <=4 SENSITIVE Sensitive     CEFEPIME <=0.12 SENSITIVE Sensitive     CEFTRIAXONE <=0.25 SENSITIVE  Sensitive     CIPROFLOXACIN <=0.25 SENSITIVE Sensitive     GENTAMICIN >=16 RESISTANT Resistant     IMIPENEM <=0.25 SENSITIVE Sensitive     NITROFURANTOIN <=16 SENSITIVE Sensitive     TRIMETH/SULFA >=320 RESISTANT Resistant     AMPICILLIN/SULBACTAM >=32 RESISTANT Resistant     PIP/TAZO <=4 SENSITIVE Sensitive     * >=100,000 COLONIES/mL ESCHERICHIA COLI  Resp Panel by RT-PCR (Flu A&B, Covid) Nasopharyngeal Swab     Status: None   Collection Time: 01/28/22 10:24 AM   Specimen: Nasopharyngeal Swab; Nasopharyngeal(NP) swabs in vial transport medium  Result Value Ref  Range Status   SARS Coronavirus 2 by RT PCR NEGATIVE NEGATIVE Final    Comment: (NOTE) SARS-CoV-2 target nucleic acids are NOT DETECTED.  The SARS-CoV-2 RNA is generally detectable in upper respiratory specimens during the acute phase of infection. The lowest concentration of SARS-CoV-2 viral copies this assay can detect is 138 copies/mL. A negative result does not preclude SARS-Cov-2 infection and should not be used as the sole basis for treatment or other patient management decisions. A negative result may occur with  improper specimen collection/handling, submission of specimen other than nasopharyngeal swab, presence of viral mutation(s) within the areas targeted by this assay, and inadequate number of viral copies(<138 copies/mL). A negative result must be combined with clinical observations, patient history, and epidemiological information. The expected result is Negative.  Fact Sheet for Patients:  EntrepreneurPulse.com.au  Fact Sheet for Healthcare Providers:  IncredibleEmployment.be  This test is no t yet approved or cleared by the [place] FDA and  has been authorized for detection and/or diagnosis of SARS-CoV-2 by FDA under an Emergency Use Authorization (EUA). This EUA will remain  in effect (meaning this test can be used) for the duration of the COVID-19 declaration under Section 564(b)(1) of the Act, 21 U.S.C.section 360bbb-3(b)(1), unless the authorization is terminated  or revoked sooner.       Influenza A by PCR NEGATIVE NEGATIVE Final   Influenza B by PCR NEGATIVE NEGATIVE Final    Comment: (NOTE) The Xpert Xpress SARS-CoV-2/FLU/RSV plus assay is intended as an aid in the diagnosis of influenza from Nasopharyngeal swab specimens and should not be used as a sole basis for treatment. Nasal washings and aspirates are unacceptable for Xpert Xpress SARS-CoV-2/FLU/RSV testing.  Fact Sheet for  Patients: EntrepreneurPulse.com.au  Fact Sheet for Healthcare Providers: IncredibleEmployment.be  This test is not yet approved or cleared by the [place] FDA and has been authorized for detection and/or diagnosis of SARS-CoV-2 by FDA under an Emergency Use Authorization (EUA). This EUA will remain in effect (meaning this test can be used) for the duration of the COVID-19 declaration under Section 564(b)(1) of the Act, 21 U.S.C. section 360bbb-3(b)(1), unless the authorization is terminated or revoked.  Performed at [facility], [address], [place], [place] [address]   Culture, blood (Routine X 2) w Reflex to ID Panel     Status: None (Preliminary result)   Collection Time: 01/28/22 11:10 AM   Specimen: BLOOD  Result Value Ref Range Status   Specimen Description BLOOD RIGHT ANTECUBITAL  Final   Special Requests   Final    BOTTLES DRAWN AEROBIC AND ANAEROBIC Blood Culture adequate volume   Culture   Final    NO GROWTH 4 DAYS Performed at [facility], [address], [place], [place] [address]    Report Status PENDING  Incomplete  Culture, blood (Routine X 2) w Reflex to ID Panel  Status: None (Preliminary result)   Collection Time: 01/28/22 11:22 AM   Specimen: BLOOD  Result Value Ref Range Status   Specimen Description BLOOD BLOOD RIGHT HAND  Final   Special Requests   Final    BOTTLES DRAWN AEROBIC AND ANAEROBIC Blood Culture adequate volume   Culture   Final    NO GROWTH 4 DAYS Performed at [facility], [address] [place], [place] [address]    Report Status PENDING  Incomplete    Labs: CBC: Recent Labs  Lab 01/28/22 0830 01/29/22 0450 01/30/22 0533 01/31/22 0550 02/01/22 0528  WBC 11.3* 13.1* 20.1* 16.3* 12.6*  NEUTROABS 7.3  --   --   --   --   HGB 6.7* 8.5* 8.5* 8.0* 8.3*  HCT 27.7* 30.7* 30.9* 30.7* 31.4*  MCV 58.4* 63.0* 63.2* 64.8* 66.2*  PLT 1,557* 1,342* 1,392* 1,196* 0,315*   Basic Metabolic  Panel: Recent Labs  Lab 01/28/22 0830 01/28/22 2232 01/29/22 0450 01/30/22 0533 01/31/22 0550 02/01/22 0528  NA 139  --  140 141 140 141  K 3.9  --  3.8 3.7 3.9 3.9  CL 107  --  104 107 108 109  CO2 25  --  '26 27 23 23  '$ GLUCOSE 82  --  97 124* 100* 92  BUN 14  --  '13 18 19 18  '$ CREATININE 0.77  --  0.89 0.90 0.80 0.88  CALCIUM 9.1  --  9.1 9.1 8.7* 8.6*  MG  --  1.9  --   --   --  2.2   Liver Function Tests: Recent Labs  Lab 01/28/22 0830 01/30/22 0533 02/01/22 0528  AST '24 18 24  '$ ALT '14 9 17  '$ ALKPHOS 55 45 49  BILITOT 0.1* 0.7 0.3  PROT 8.1 6.5 6.2*  ALBUMIN 4.2 3.7 3.3*   CBG: No results for input(s): GLUCAP in the last 168 hours.  Discharge time spent: greater than 30 minutes.  Signed: Flora Lipps, MD [facility] 02/01/2022

## 2022-02-01 NOTE — Telephone Encounter (Signed)
Dena - please arrange CBC in 1 week for patient and notify her please.  Erline Levine - please arrange hospital f/u in about 2-3 weeks. She was seen by consult with Dr. Abbey Chatters in the hospital with [facility] performing her procedures.   Venetia Night, MSN, FNP-BC, AGACNP-BC [facility]

## 2022-02-02 ENCOUNTER — Encounter (HOSPITAL_COMMUNITY): Payer: Self-pay | Admitting: Internal Medicine

## 2022-02-02 LAB — CULTURE, BLOOD (ROUTINE X 2)
Culture: NO GROWTH
Culture: NO GROWTH
Special Requests: ADEQUATE
Special Requests: ADEQUATE

## 2022-02-05 ENCOUNTER — Encounter (HOSPITAL_COMMUNITY): Payer: Self-pay | Admitting: Internal Medicine
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
## 2022-02-14 ENCOUNTER — Encounter: Payer: Self-pay | Admitting: Internal Medicine

## 2022-02-14 ENCOUNTER — Ambulatory Visit: Payer: Self-pay | Admitting: Gastroenterology

## 2023-04-10 ENCOUNTER — Ambulatory Visit (INDEPENDENT_AMBULATORY_CARE_PROVIDER_SITE_OTHER): Payer: Self-pay

## 2023-04-10 ENCOUNTER — Ambulatory Visit
Admission: EM | Admit: 2023-04-10 | Discharge: 2023-04-10 | Disposition: A | Discharge status: Home / Self Care | Payer: Self-pay | Attending: Family Medicine | Admitting: Family Medicine

## 2023-04-10 DIAGNOSIS — M25531 Pain in right wrist: Secondary | ICD-10-CM

## 2023-04-10 DIAGNOSIS — M25532 Pain in left wrist: Secondary | ICD-10-CM

## 2023-04-10 MED ORDER — HYDROCODONE-ACETAMINOPHEN 7.5-325 MG/15ML PO SOLN: ORAL | 0 refills | Status: AC

## 2023-04-10 NOTE — Discharge Instructions (Signed)
Be aware, you have been prescribed pain medications that may cause drowsiness. While taking this medication, do not take any other medications containing acetaminophen (Tylenol). Do not combine with alcohol or recreational drugs. Please do not drive, operate heavy machinery, or take part in activities that require making important decisions while on this medication as your judgement may be clouded.  
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
## 2023-04-10 NOTE — ED Triage Notes (Signed)
Pt c/o fall resulting in bilateral wrist injury. Pt states she was walking and tripped over a tree root yesterday afternoon mild swelling is present in both wrists. Pain shoots down into the fingers.

## 2023-08-19 ENCOUNTER — Emergency Department (HOSPITAL_COMMUNITY): Payer: Self-pay

## 2023-08-19 ENCOUNTER — Encounter (HOSPITAL_COMMUNITY): Payer: Self-pay | Admitting: *Deleted

## 2023-08-19 ENCOUNTER — Other Ambulatory Visit: Payer: Self-pay

## 2023-08-19 ENCOUNTER — Emergency Department (HOSPITAL_COMMUNITY)
Admission: EM | Admit: 2023-08-19 | Discharge: 2023-08-19 | Disposition: A | Discharge status: Home / Self Care | Payer: Self-pay | Attending: Student | Admitting: Student

## 2023-08-19 DIAGNOSIS — R1031 Right lower quadrant pain: Secondary | ICD-10-CM

## 2023-08-19 DIAGNOSIS — Z853 Personal history of malignant neoplasm of breast: Secondary | ICD-10-CM | POA: Insufficient documentation

## 2023-08-19 DIAGNOSIS — N390 Urinary tract infection, site not specified: Secondary | ICD-10-CM | POA: Insufficient documentation

## 2023-08-19 DIAGNOSIS — Z8541 Personal history of malignant neoplasm of cervix uteri: Secondary | ICD-10-CM | POA: Insufficient documentation

## 2023-08-19 LAB — CBC WITH DIFFERENTIAL/PLATELET
Abs Immature Granulocytes: 0.01 10*3/uL (ref 0.00–0.07)
Basophils Absolute: 0.1 10*3/uL (ref 0.0–0.1)
Basophils Relative: 2 %
Eosinophils Absolute: 0.1 10*3/uL (ref 0.0–0.5)
Eosinophils Relative: 2 %
HCT: 38 % (ref 36.0–46.0)
Hemoglobin: 10.9 g/dL — ABNORMAL LOW (ref 12.0–15.0)
Immature Granulocytes: 0 %
Lymphocytes Relative: 30 %
Lymphs Abs: 1.6 10*3/uL (ref 0.7–4.0)
MCH: 18.8 pg — ABNORMAL LOW (ref 26.0–34.0)
MCHC: 28.7 g/dL — ABNORMAL LOW (ref 30.0–36.0)
MCV: 65.6 fL — ABNORMAL LOW (ref 80.0–100.0)
Monocytes Absolute: 0.6 10*3/uL (ref 0.1–1.0)
Monocytes Relative: 10 %
Neutro Abs: 2.9 10*3/uL (ref 1.7–7.7)
Neutrophils Relative %: 56 %
Platelets: 501 10*3/uL — ABNORMAL HIGH (ref 150–400)
RBC: 5.79 MIL/uL — ABNORMAL HIGH (ref 3.87–5.11)
RDW: 22.8 % — ABNORMAL HIGH (ref 11.5–15.5)
WBC: 5.4 10*3/uL (ref 4.0–10.5)
nRBC: 0 % (ref 0.0–0.2)

## 2023-08-19 LAB — BASIC METABOLIC PANEL
Anion gap: 10 (ref 5–15)
BUN: 15 mg/dL (ref 6–20)
CO2: 24 mmol/L (ref 22–32)
Calcium: 9.5 mg/dL (ref 8.9–10.3)
Chloride: 106 mmol/L (ref 98–111)
Creatinine, Ser: 0.79 mg/dL (ref 0.44–1.00)
GFR, Estimated: >60 mL/min (ref >60)
Glucose, Bld: 93 mg/dL (ref 70–99)
Potassium: 3.8 mmol/L (ref 3.5–5.1)
Sodium: 140 mmol/L (ref 135–145)

## 2023-08-19 LAB — URINALYSIS, ROUTINE W REFLEX MICROSCOPIC
Bilirubin Urine: NEGATIVE
Glucose, UA: NEGATIVE mg/dL
Ketones, ur: NEGATIVE mg/dL
Nitrite: POSITIVE — AB
Protein, ur: 100 mg/dL — AB
RBC / HPF: >50 RBC/hpf (ref 0–5)
Specific Gravity, Urine: 1.024 (ref 1.005–1.030)
WBC, UA: >50 WBC/hpf (ref 0–5)
pH: 5 (ref 5.0–8.0)

## 2023-08-19 LAB — PREGNANCY, URINE: Preg Test, Ur: NEGATIVE

## 2023-08-19 LAB — LACTIC ACID, PLASMA: Lactic Acid, Venous: 1.1 mmol/L (ref 0.5–1.9)

## 2023-08-19 LAB — HEPATIC FUNCTION PANEL
ALT: 12 U/L (ref 0–44)
AST: 21 U/L (ref 15–41)
Albumin: 4 g/dL (ref 3.5–5.0)
Alkaline Phosphatase: 72 U/L (ref 38–126)
Bilirubin, Direct: 0.1 mg/dL (ref 0.0–0.2)
Indirect Bilirubin: 0.2 mg/dL — ABNORMAL LOW (ref 0.3–0.9)
Total Bilirubin: 0.3 mg/dL (ref <1.2)
Total Protein: 7.8 g/dL (ref 6.5–8.1)

## 2023-08-19 MED ORDER — CEPHALEXIN 500 MG PO CAPS: 500.0000 mg | ORAL_CAPSULE | Freq: Four times a day (QID) | ORAL | 0 refills | Status: AC

## 2023-08-19 MED ORDER — ONDANSETRON HCL 4 MG/2ML IJ SOLN
4.0000 mg | Freq: Once | INTRAMUSCULAR | Status: AC
  Administered 2023-08-19: 4 mg via INTRAVENOUS
  Filled 2023-08-19: qty 2

## 2023-08-19 MED ORDER — HYDROMORPHONE HCL 1 MG/ML IJ SOLN
0.5000 mg | Freq: Once | INTRAMUSCULAR | Status: AC
  Administered 2023-08-19: 0.5 mg via INTRAVENOUS
  Filled 2023-08-19: qty 0.5

## 2023-08-19 MED ORDER — IOHEXOL 300 MG/ML  SOLN
100.0000 mL | Freq: Once | INTRAMUSCULAR | Status: AC | PRN
  Administered 2023-08-19: 100 mL via INTRAVENOUS

## 2023-08-19 MED ORDER — SODIUM CHLORIDE 0.9 % IV SOLN
1.0000 g | Freq: Once | INTRAVENOUS | Status: AC
  Administered 2023-08-19: 1 g via INTRAVENOUS
  Filled 2023-08-19: qty 10

## 2023-08-19 NOTE — ED Notes (Signed)
Attempted IV access without success x2.

## 2023-08-19 NOTE — ED Provider Notes (Signed)
[facility] AT [facility] Provider Note   CSN: [id number] Arrival date & time: 08/19/23  1148     History  Chief Complaint  Patient presents with   Dysuria    Tanya Stout is a 57 y.o. female.  57 year old female with history of anemia, breast cancer, cervical cancer, meningitis, RMSF, s/p splenectomy presents with complaint of low back pain, RLQ pain, and dysuria. Patient states that her back pain and dysuria began 4-5 days ago. She rates her back pain a 10/10 and describes the pain as dull. She also notes burning with urination that feels like "needles", dizziness and lightheadedness upon standing, fevers, weakness, and poor appetite. Patient also endorses N/V x 4 days. Of note, she states she has thrown up 3 times since checking into the ED. Over the past hour, patient also notes that she has developed RLQ pain which she describes as sharp in nature and worsening. At home, patient states she tried Tylenol with no relief. Patient endorses vaginal spotting; however, she denies vaginal discharge, hematuria, diarrhea, chest pain, or SOB. Of note, patient has a history of iron deficiency anemia in which she has received iron transfusions. Patient feels her iron may be low today, contributing to her weakness and dizziness. Patient denies any concerns for STI's today and states she is "menopausal".       Home Medications Prior to Admission medications   Medication Sig Start Date End Date Taking? Authorizing Provider  ferrous sulfate 325 (65 FE) MG tablet Take 1 tablet (325 mg total) by mouth daily. 02/01/22 02/01/23  Pokhrel, Rebekah Chesterfield, MD  FLUoxetine (PROZAC) 20 MG capsule Take 1 capsule (20 mg total) by mouth daily. 02/01/22   Pokhrel, Rebekah Chesterfield, MD  Homeopathic Products (EARACHE DROPS OT) Place 2 drops in ear(s) daily as needed (ear ache).    [provider]  HYDROcodone-acetaminophen (HYCET) 7.5-325 mg/15 ml solution Take 2.20mL-5mL every six hours as needed  for severe pain. 04/10/23   Mardella Layman, MD  hydrOXYzine (ATARAX) 25 MG tablet Take 1 tablet (25 mg total) by mouth 3 (three) times daily as needed for anxiety. 02/01/22   Pokhrel, Rebekah Chesterfield, MD      Allergies    Sodium ferric gluconate [ferrous gluconate], Toradol [ketorolac tromethamine], Morphine, and Penicillins    Review of Systems   Review of Systems Negative except as per HPI Physical Exam Updated Vital Signs BP (!) 128/90   Pulse 96   Temp 98.6 F (37 C) (Oral)   Resp 14   Ht 5\' 4"  (1.626 m)   Wt 53.1 kg   SpO2 100%   BMI 20.08 kg/m  Physical Exam Vitals and nursing note reviewed.  Constitutional:      General: She is not in acute distress.    Appearance: She is well-developed. She is not diaphoretic.  HENT:     Head: Normocephalic and atraumatic.     Mouth/Throat:     Mouth: Mucous membranes are dry.  Cardiovascular:     Rate and Rhythm: Normal rate and regular rhythm.     Heart sounds: Normal heart sounds.  Pulmonary:     Effort: Pulmonary effort is normal.  Abdominal:     Palpations: Abdomen is soft.     Tenderness: There is abdominal tenderness in the right lower quadrant. There is guarding. There is no right CVA tenderness or left CVA tenderness.  Skin:    General: Skin is warm and dry.     Findings: No erythema or  rash.  Neurological:     Mental Status: She is alert and oriented to person, place, and time.  Psychiatric:        Behavior: Behavior normal.     ED Results / Procedures / Treatments   Labs (all labs ordered are listed, but only abnormal results are displayed) Labs Reviewed  URINALYSIS, ROUTINE W REFLEX MICROSCOPIC - Abnormal; Notable for the following components:      Result Value   APPearance HAZY (*)    Hgb urine dipstick LARGE (*)    Protein, ur 100 (*)    Nitrite POSITIVE (*)    Leukocytes,Ua MODERATE (*)    Bacteria, UA MANY (*)    All other components within normal limits  CBC WITH DIFFERENTIAL/PLATELET - Abnormal; Notable for  the following components:   RBC 5.79 (*)    Hemoglobin 10.9 (*)    MCV 65.6 (*)    MCH 18.8 (*)    MCHC 28.7 (*)    RDW 22.8 (*)    Platelets 501 (*)    All other components within normal limits  HEPATIC FUNCTION PANEL - Abnormal; Notable for the following components:   Indirect Bilirubin 0.2 (*)    All other components within normal limits  CULTURE, BLOOD (ROUTINE X 2)  CULTURE, BLOOD (ROUTINE X 2)  URINE CULTURE  BASIC METABOLIC PANEL  PREGNANCY, URINE  LACTIC ACID, PLASMA    EKG None  Radiology No results found.  Procedures Procedures    Medications Ordered in ED Medications  HYDROmorphone (DILAUDID) injection 0.5 mg (0.5 mg Intravenous Given 08/19/23 1931)  ondansetron (ZOFRAN) injection 4 mg (4 mg Intravenous Given 08/19/23 1931)  cefTRIAXone (ROCEPHIN) 1 g in sodium chloride 0.9 % 100 mL IVPB (1 g Intravenous New Bag/Given 08/19/23 1932)  iohexol (OMNIPAQUE) 300 MG/ML solution 100 mL (100 mLs Intravenous Contrast Given 08/19/23 1835)    ED Course/ Medical Decision Making/ A&P                                 Medical Decision Making Amount and/or Complexity of Data Reviewed Labs: ordered. Radiology: ordered.  Risk Prescription drug management.   This patient presents to the ED for concern of abdominal pain, nausea, this involves an extensive number of treatment options, and is a complaint that carries with it a high risk of complications and morbidity.  The differential diagnosis includes but not limited to UTI, pyelonephritis, kidney stone, appendicitis     Co morbidities that complicate the patient evaluation  Anemia, breast and cervical cancer, RMSF, asplenic, plumber vincent syndrome    Additional history obtained:  External records from outside source obtained and reviewed including prior labs and medical history on file    Lab Tests:  I Ordered, and personally interpreted labs.  The pertinent results include:  lactic 1.1. CBC with normal WBC,  baseline hgb. CMP without significant findings. Hcg negative. UA with many bacteria, nitrite and leukocyte positive with >50 RBC and WBC.    Imaging Studies ordered:  I ordered imaging studies including CT a/p  Pending at time of sign out   Problem List / ED Course / Critical interventions / Medication management  57 year old female with complaint of low back pain, RLQ pain, weakness. RLQ pain on exam. Labs reveal UTI. Lactic and WBC normal. Provided with rocephin for her UTI. CT pending at time of sign out to oncoming provider. I ordered medication including dilaudid,  zofran, rocephin  for pain, nausea, UTI  Reevaluation of the patient after these medicines showed that the patient improved I have reviewed the patients home medicines and have made adjustments as needed   Social Determinants of Health:  Lives at home   Test / Admission - Considered:  Dispo pending at time of sign out to oncoming provider pending CT.          Final Clinical Impression(s) / ED Diagnoses Final diagnoses:  Right lower quadrant abdominal pain  Urinary tract infection in female    Rx / DC Orders ED Discharge Orders     None         Alden Hipp 08/19/23 2159    Bethann Berkshire, MD 08/21/23 1623

## 2023-08-19 NOTE — ED Triage Notes (Signed)
Pt with lower back pain and burning with urination 4 days ago.  Severe nausea and generalized weakness x 3-4 days. Took tylenol 5 hours ago.

## 2023-08-19 NOTE — Discharge Instructions (Signed)
Take antibiotics as prescribed and complete the full course. Follow up with your primary care provider for recheck. Return to the ER for worsening or concerning symptoms.

## 2023-08-19 NOTE — ED Provider Notes (Signed)
Patient is a handoff from [place], [place].  Plan at this time is to await results of CTAP.  Current diagnosis is UTI but may require admission given prior history of pyelonephritis and asplenia.  Patient otherwise stable at this time. Physical Exam  BP (!) 128/90   Pulse 96   Temp 98.6 F (37 C) (Oral)   Resp 14   Ht 5\' 4"  (1.626 m)   Wt 53.1 kg   SpO2 100%   BMI 20.08 kg/m   Physical Exam  Procedures  Procedures  ED Course / MDM    Medical Decision Making Amount and/or Complexity of Data Reviewed Labs: ordered. Radiology: ordered.  Risk Prescription drug management.   Patient is a handoff from [place], [place].  Please see her note for full HPI and physical exam findings. Plan at this time is to await results of CTAP.  Current diagnosis is UTI but may require admission given prior history of pyelonephritis and asplenia.  Patient otherwise stable at this time.  CT abdomen pelvis is unremarkable for any acute findings suggesting complicated infection.  No evidence of pyelonephritis, stone, or other abnormality.  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  Informed patient of these findings and advised patient to follow-up primary care provider.  Prescription for cephalexin to pharmacy.  Patient otherwise stable for discharge home and outpatient follow-up.       Smitty Knudsen, PA-C 08/19/23 2235    Bethann Berkshire, MD 08/21/23 [phone]

## 2023-08-21 LAB — URINE CULTURE: Culture: >=100000 — AB

## 2023-08-22 ENCOUNTER — Telehealth (HOSPITAL_BASED_OUTPATIENT_CLINIC_OR_DEPARTMENT_OTHER): Payer: Self-pay | Admitting: *Deleted

## 2023-08-22 NOTE — Telephone Encounter (Signed)
Post ED Visit - Positive Culture Follow-up  Culture report reviewed by antimicrobial stewardship pharmacist: [facility] [x]  Daylene Posey Pharm.D. []  Celedonio Miyamoto, Pharm.D., BCPS AQ-ID []  Garvin Fila, Pharm.D., BCPS []  Georgina Pillion, Pharm.D., BCPS []  [place], [address].D., BCPS, AAHIVP []  Estella Husk, Pharm.D., BCPS, AAHIVP []  Lysle Pearl, PharmD, BCPS []  Phillips Climes, PharmD, BCPS []  Agapito Games, PharmD, BCPS []  Verlan Friends, PharmD []  Mervyn Gay, PharmD, BCPS []  Vinnie Level, PharmD  [facility] []  Len Childs, PharmD []  Greer Pickerel, PharmD []  Adalberto Cole, PharmD []  Perlie Gold, Rph []  Lonell Face) Jean Rosenthal, PharmD []  Earl Many, PharmD []  Junita Push, PharmD []  Dorna Leitz, PharmD []  Terrilee Files, PharmD []  Lynann Beaver, PharmD []  Keturah Barre, PharmD []  Loralee Pacas, PharmD []  Bernadene Person, PharmD   Positive urine culture Treated with Cephalexin, organism sensitive to the same and no further patient follow-up is required at this time.  Virl Axe [facility] 08/22/2023, 8:33 AM

## 2023-08-24 LAB — CULTURE, BLOOD (ROUTINE X 2)
Culture: NO GROWTH
Culture: NO GROWTH
Special Requests: ADEQUATE
Special Requests: ADEQUATE
